# Patient Record
Sex: Male | Born: 1945
Health system: Southern US, Community
[De-identification: ages and names within clinical notes are randomized; demographics above are authoritative.]

## PROBLEM LIST (undated history)

## (undated) DIAGNOSIS — R7302 Impaired glucose tolerance (oral): Secondary | ICD-10-CM

## (undated) DIAGNOSIS — H1030 Unspecified acute conjunctivitis, unspecified eye: Secondary | ICD-10-CM

## (undated) DIAGNOSIS — I10 Essential (primary) hypertension: Secondary | ICD-10-CM

## (undated) DIAGNOSIS — M109 Gout, unspecified: Secondary | ICD-10-CM

## (undated) DIAGNOSIS — F411 Generalized anxiety disorder: Secondary | ICD-10-CM

## (undated) DIAGNOSIS — E039 Hypothyroidism, unspecified: Secondary | ICD-10-CM

## (undated) DIAGNOSIS — J309 Allergic rhinitis, unspecified: Secondary | ICD-10-CM

## (undated) DIAGNOSIS — R21 Rash and other nonspecific skin eruption: Secondary | ICD-10-CM

## (undated) DIAGNOSIS — E739 Lactose intolerance, unspecified: Secondary | ICD-10-CM

## (undated) DIAGNOSIS — J069 Acute upper respiratory infection, unspecified: Secondary | ICD-10-CM

## (undated) DIAGNOSIS — E559 Vitamin D deficiency, unspecified: Secondary | ICD-10-CM

## (undated) DIAGNOSIS — J019 Acute sinusitis, unspecified: Secondary | ICD-10-CM

## (undated) DIAGNOSIS — G4710 Hypersomnia, unspecified: Secondary | ICD-10-CM

## (undated) DIAGNOSIS — R609 Edema, unspecified: Secondary | ICD-10-CM

## (undated) DIAGNOSIS — E7800 Pure hypercholesterolemia, unspecified: Secondary | ICD-10-CM

## (undated) DIAGNOSIS — R7301 Impaired fasting glucose: Secondary | ICD-10-CM

## (undated) DIAGNOSIS — N400 Enlarged prostate without lower urinary tract symptoms: Secondary | ICD-10-CM

## (undated) DIAGNOSIS — E291 Testicular hypofunction: Secondary | ICD-10-CM

## (undated) DIAGNOSIS — E785 Hyperlipidemia, unspecified: Secondary | ICD-10-CM

## (undated) HISTORY — DX: Hypersomnia, unspecified: G47.10

## (undated) HISTORY — DX: Pure hypercholesterolemia, unspecified: E78.00

## (undated) HISTORY — DX: Generalized anxiety disorder: F41.1

## (undated) HISTORY — PX: HERNIA REPAIR: SHX51

## (undated) HISTORY — DX: Rash and other nonspecific skin eruption: R21

## (undated) HISTORY — DX: Hyperlipidemia, unspecified: E78.5

## (undated) HISTORY — DX: Morbid (severe) obesity due to excess calories: E66.01

## (undated) HISTORY — DX: Hypothyroidism, unspecified: E03.9

## (undated) HISTORY — DX: Essential (primary) hypertension: I10

## (undated) HISTORY — DX: Lactose intolerance, unspecified: E73.9

## (undated) HISTORY — DX: Testicular hypofunction: E29.1

## (undated) HISTORY — DX: Gout, unspecified: M10.9

## (undated) HISTORY — DX: Acute sinusitis, unspecified: J01.90

## (undated) HISTORY — DX: Impaired fasting glucose: R73.01

## (undated) HISTORY — DX: Benign prostatic hyperplasia without lower urinary tract symptoms: N40.0

## (undated) HISTORY — DX: Allergic rhinitis, unspecified: J30.9

## (undated) HISTORY — DX: Acute upper respiratory infection, unspecified: J06.9

## (undated) HISTORY — DX: Edema, unspecified: R60.9

## (undated) HISTORY — DX: Vitamin D deficiency, unspecified: E55.9

## (undated) HISTORY — DX: Unspecified acute conjunctivitis, unspecified eye: H10.30

## (undated) HISTORY — DX: Impaired glucose tolerance (oral): R73.02

---

## 1976-06-10 HISTORY — PX: URETER SURGERY: SHX823

## 1997-12-17 ENCOUNTER — Emergency Department (HOSPITAL_COMMUNITY): Admission: EM | Admit: 1997-12-17 | Discharge: 1997-12-17 | Payer: Self-pay

## 1999-05-14 ENCOUNTER — Encounter: Payer: Self-pay | Admitting: Orthopedic Surgery

## 1999-05-14 ENCOUNTER — Ambulatory Visit (HOSPITAL_COMMUNITY): Admission: RE | Admit: 1999-05-14 | Discharge: 1999-05-14 | Payer: Self-pay | Admitting: Orthopedic Surgery

## 2003-07-22 ENCOUNTER — Emergency Department (HOSPITAL_COMMUNITY): Admission: EM | Admit: 2003-07-22 | Discharge: 2003-07-22 | Payer: Self-pay | Admitting: Emergency Medicine

## 2004-05-17 ENCOUNTER — Ambulatory Visit: Payer: Self-pay | Admitting: Cardiology

## 2004-10-02 ENCOUNTER — Ambulatory Visit: Payer: Self-pay

## 2004-10-12 ENCOUNTER — Ambulatory Visit: Payer: Self-pay | Admitting: Cardiovascular Disease

## 2005-01-10 ENCOUNTER — Ambulatory Visit: Payer: Self-pay | Admitting: Internal Medicine

## 2005-02-14 ENCOUNTER — Ambulatory Visit: Payer: Self-pay | Admitting: Internal Medicine

## 2005-04-02 ENCOUNTER — Ambulatory Visit: Payer: Self-pay | Admitting: Internal Medicine

## 2005-04-02 LAB — CONVERTED CEMR LAB: PSA: 0.38 ng/mL

## 2005-04-16 ENCOUNTER — Ambulatory Visit: Payer: Self-pay | Admitting: Internal Medicine

## 2005-09-07 ENCOUNTER — Emergency Department (HOSPITAL_COMMUNITY): Admission: EM | Admit: 2005-09-07 | Discharge: 2005-09-07 | Payer: Self-pay | Admitting: Emergency Medicine

## 2005-09-16 ENCOUNTER — Ambulatory Visit: Payer: Self-pay | Admitting: Internal Medicine

## 2005-09-27 ENCOUNTER — Ambulatory Visit: Payer: Self-pay

## 2005-10-03 ENCOUNTER — Ambulatory Visit: Payer: Self-pay | Admitting: *Deleted

## 2005-10-10 ENCOUNTER — Ambulatory Visit: Payer: Self-pay | Admitting: Cardiology

## 2006-03-27 ENCOUNTER — Ambulatory Visit: Payer: Self-pay | Admitting: Cardiology

## 2006-04-03 ENCOUNTER — Ambulatory Visit: Payer: Self-pay | Admitting: Cardiology

## 2006-04-08 ENCOUNTER — Ambulatory Visit: Payer: Self-pay | Admitting: Internal Medicine

## 2006-04-08 LAB — CONVERTED CEMR LAB
ALT: 25 units/L (ref 0–40)
AST: 23 units/L (ref 0–37)
Albumin: 4 g/dL (ref 3.5–5.2)
Alkaline Phosphatase: 50 units/L (ref 39–117)
BUN: 10 mg/dL (ref 6–23)
Basophils Absolute: 0 10*3/uL (ref 0.0–0.1)
Basophils Relative: 0.3 % (ref 0.0–1.0)
Bilirubin Urine: NEGATIVE
CO2: 31 meq/L (ref 19–32)
Calcium: 9.6 mg/dL (ref 8.4–10.5)
Chloride: 104 meq/L (ref 96–112)
Chol/HDL Ratio, serum: 4.3
Cholesterol: 148 mg/dL (ref 0–200)
Creatinine, Ser: 1.3 mg/dL (ref 0.4–1.5)
Eosinophil percent: 2 % (ref 0.0–5.0)
GFR calc non Af Amer: 60 mL/min
Glomerular Filtration Rate, Af Am: 73 mL/min/{1.73_m2}
Glucose, Bld: 117 mg/dL — ABNORMAL HIGH (ref 70–99)
HCT: 45.6 % (ref 39.0–52.0)
HDL: 34.5 mg/dL — ABNORMAL LOW (ref >39.0)
Hemoglobin, Urine: NEGATIVE
Hemoglobin: 15.2 g/dL (ref 13.0–17.0)
Ketones, ur: NEGATIVE mg/dL
LDL Cholesterol: 84 mg/dL (ref 0–99)
Leukocytes, UA: NEGATIVE
Lymphocytes Relative: 27.1 % (ref 12.0–46.0)
MCHC: 33.4 g/dL (ref 30.0–36.0)
MCV: 88.8 fL (ref 78.0–100.0)
Monocytes Absolute: 0.6 10*3/uL (ref 0.2–0.7)
Monocytes Relative: 9.7 % (ref 3.0–11.0)
Neutro Abs: 3.8 10*3/uL (ref 1.4–7.7)
Neutrophils Relative %: 60.9 % (ref 43.0–77.0)
Nitrite: NEGATIVE
PSA: 0.39 ng/mL (ref 0.10–4.00)
Platelets: 158 10*3/uL (ref 150–400)
Potassium: 5.1 meq/L (ref 3.5–5.1)
RBC: 5.13 M/uL (ref 4.22–5.81)
RDW: 12.6 % (ref 11.5–14.6)
Sodium: 140 meq/L (ref 135–145)
Specific Gravity, Urine: 1.015 (ref 1.000–1.03)
TSH: 9.19 microintl units/mL — ABNORMAL HIGH (ref 0.35–5.50)
Total Bilirubin: 1.5 mg/dL — ABNORMAL HIGH (ref 0.3–1.2)
Total Protein, Urine: NEGATIVE mg/dL
Total Protein: 7.5 g/dL (ref 6.0–8.3)
Triglyceride fasting, serum: 147 mg/dL (ref 0–149)
Urine Glucose: NEGATIVE mg/dL
Urobilinogen, UA: 0.2 (ref 0.0–1.0)
VLDL: 29 mg/dL (ref 0–40)
WBC: 6.2 10*3/uL (ref 4.5–10.5)
pH: 6.5 (ref 5.0–8.0)

## 2006-04-11 ENCOUNTER — Ambulatory Visit: Payer: Self-pay | Admitting: Internal Medicine

## 2006-05-30 ENCOUNTER — Ambulatory Visit: Payer: Self-pay | Admitting: Internal Medicine

## 2006-05-30 LAB — CONVERTED CEMR LAB: TSH: 0.95 microintl units/mL (ref 0.35–5.50)

## 2006-09-29 ENCOUNTER — Ambulatory Visit: Payer: Self-pay

## 2006-09-29 LAB — CONVERTED CEMR LAB
ALT: 23 units/L (ref 0–40)
AST: 19 units/L (ref 0–37)
Albumin: 4 g/dL (ref 3.5–5.2)
Alkaline Phosphatase: 54 units/L (ref 39–117)
Bilirubin, Direct: 0.1 mg/dL (ref 0.0–0.3)
Cholesterol: 135 mg/dL (ref 0–200)
HDL: 37.1 mg/dL — ABNORMAL LOW (ref >39.0)
LDL Cholesterol: 65 mg/dL (ref 0–99)
Total Bilirubin: 1.1 mg/dL (ref 0.3–1.2)
Total CHOL/HDL Ratio: 3.6
Total Protein: 7.2 g/dL (ref 6.0–8.3)
Triglycerides: 164 mg/dL — ABNORMAL HIGH (ref 0–149)
VLDL: 33 mg/dL (ref 0–40)

## 2006-10-02 ENCOUNTER — Ambulatory Visit: Payer: Self-pay | Admitting: Cardiology

## 2007-03-03 ENCOUNTER — Ambulatory Visit: Payer: Self-pay | Admitting: Internal Medicine

## 2007-03-04 ENCOUNTER — Encounter: Payer: Self-pay | Admitting: Internal Medicine

## 2007-03-04 DIAGNOSIS — E039 Hypothyroidism, unspecified: Secondary | ICD-10-CM

## 2007-03-04 DIAGNOSIS — E785 Hyperlipidemia, unspecified: Secondary | ICD-10-CM

## 2007-03-04 DIAGNOSIS — I10 Essential (primary) hypertension: Secondary | ICD-10-CM | POA: Insufficient documentation

## 2007-03-04 DIAGNOSIS — M109 Gout, unspecified: Secondary | ICD-10-CM

## 2007-03-04 DIAGNOSIS — F411 Generalized anxiety disorder: Secondary | ICD-10-CM | POA: Insufficient documentation

## 2007-03-04 DIAGNOSIS — N400 Enlarged prostate without lower urinary tract symptoms: Secondary | ICD-10-CM | POA: Insufficient documentation

## 2007-03-04 HISTORY — DX: Morbid (severe) obesity due to excess calories: E66.01

## 2007-03-04 HISTORY — DX: Essential (primary) hypertension: I10

## 2007-03-04 HISTORY — DX: Benign prostatic hyperplasia without lower urinary tract symptoms: N40.0

## 2007-03-04 HISTORY — DX: Gout, unspecified: M10.9

## 2007-03-04 HISTORY — DX: Hyperlipidemia, unspecified: E78.5

## 2007-03-04 HISTORY — DX: Hypothyroidism, unspecified: E03.9

## 2007-03-04 HISTORY — DX: Generalized anxiety disorder: F41.1

## 2007-03-30 ENCOUNTER — Ambulatory Visit: Payer: Self-pay | Admitting: Cardiology

## 2007-03-30 LAB — CONVERTED CEMR LAB
ALT: 19 units/L (ref 0–53)
AST: 17 units/L (ref 0–37)
Albumin: 4 g/dL (ref 3.5–5.2)
Alkaline Phosphatase: 51 units/L (ref 39–117)
Bilirubin, Direct: 0.2 mg/dL (ref 0.0–0.3)
Cholesterol: 129 mg/dL (ref 0–200)
HDL: 35.1 mg/dL — ABNORMAL LOW (ref >39.0)
LDL Cholesterol: 64 mg/dL (ref 0–99)
Total Bilirubin: 1 mg/dL (ref 0.3–1.2)
Total CHOL/HDL Ratio: 3.7
Total Protein: 7.5 g/dL (ref 6.0–8.3)
Triglycerides: 152 mg/dL — ABNORMAL HIGH (ref 0–149)
VLDL: 30 mg/dL (ref 0–40)

## 2007-04-02 ENCOUNTER — Ambulatory Visit: Payer: Self-pay | Admitting: Cardiovascular Disease

## 2007-04-16 ENCOUNTER — Ambulatory Visit: Payer: Self-pay | Admitting: Internal Medicine

## 2007-04-16 DIAGNOSIS — R7301 Impaired fasting glucose: Secondary | ICD-10-CM | POA: Insufficient documentation

## 2007-04-16 HISTORY — DX: Impaired fasting glucose: R73.01

## 2007-04-16 LAB — CONVERTED CEMR LAB
ALT: 28 units/L (ref 0–53)
AST: 21 units/L (ref 0–37)
Alkaline Phosphatase: 56 units/L (ref 39–117)
BUN: 8 mg/dL (ref 6–23)
Basophils Relative: 0.5 % (ref 0.0–1.0)
Bilirubin Urine: NEGATIVE
CO2: 30 meq/L (ref 19–32)
Calcium: 9.4 mg/dL (ref 8.4–10.5)
Chloride: 106 meq/L (ref 96–112)
Creatinine, Ser: 1.1 mg/dL (ref 0.4–1.5)
Hemoglobin: 14.7 g/dL (ref 13.0–17.0)
Leukocytes, UA: NEGATIVE
Monocytes Absolute: 0.5 10*3/uL (ref 0.2–0.7)
Monocytes Relative: 7.4 % (ref 3.0–11.0)
Platelets: 165 10*3/uL (ref 150–400)
RDW: 12.7 % (ref 11.5–14.6)
Specific Gravity, Urine: 1.01 (ref 1.000–1.03)
Total Bilirubin: 1.2 mg/dL (ref 0.3–1.2)
Total Protein, Urine: NEGATIVE mg/dL
Total Protein: 7.2 g/dL (ref 6.0–8.3)
Triglycerides: 188 mg/dL — ABNORMAL HIGH (ref 0–149)
VLDL: 38 mg/dL (ref 0–40)
pH: 7.5 (ref 5.0–8.0)

## 2007-04-23 ENCOUNTER — Ambulatory Visit: Payer: Self-pay | Admitting: Internal Medicine

## 2007-04-23 DIAGNOSIS — E739 Lactose intolerance, unspecified: Secondary | ICD-10-CM | POA: Insufficient documentation

## 2007-04-23 HISTORY — DX: Lactose intolerance, unspecified: E73.9

## 2007-07-01 ENCOUNTER — Ambulatory Visit: Payer: Self-pay | Admitting: Internal Medicine

## 2007-09-17 ENCOUNTER — Ambulatory Visit: Payer: Self-pay | Admitting: Internal Medicine

## 2007-09-24 ENCOUNTER — Ambulatory Visit: Payer: Self-pay | Admitting: Cardiology

## 2007-10-07 ENCOUNTER — Ambulatory Visit: Payer: Self-pay | Admitting: Internal Medicine

## 2007-10-07 DIAGNOSIS — J309 Allergic rhinitis, unspecified: Secondary | ICD-10-CM

## 2007-10-07 HISTORY — DX: Allergic rhinitis, unspecified: J30.9

## 2007-11-27 ENCOUNTER — Ambulatory Visit: Payer: Self-pay | Admitting: Internal Medicine

## 2007-11-27 DIAGNOSIS — H1030 Unspecified acute conjunctivitis, unspecified eye: Secondary | ICD-10-CM | POA: Insufficient documentation

## 2007-11-27 DIAGNOSIS — J069 Acute upper respiratory infection, unspecified: Secondary | ICD-10-CM | POA: Insufficient documentation

## 2007-11-27 HISTORY — DX: Unspecified acute conjunctivitis, unspecified eye: H10.30

## 2007-11-27 HISTORY — DX: Acute upper respiratory infection, unspecified: J06.9

## 2008-01-25 ENCOUNTER — Telehealth: Payer: Self-pay | Admitting: Internal Medicine

## 2008-02-04 ENCOUNTER — Ambulatory Visit: Payer: Self-pay | Admitting: Cardiology

## 2008-02-04 LAB — CONVERTED CEMR LAB
ALT: 19 units/L (ref 0–53)
Albumin: 4.2 g/dL (ref 3.5–5.2)
HDL: 34.3 mg/dL — ABNORMAL LOW (ref >39.0)
Total Bilirubin: 1.3 mg/dL — ABNORMAL HIGH (ref 0.3–1.2)
Total CHOL/HDL Ratio: 3.7
VLDL: 24 mg/dL (ref 0–40)

## 2008-02-11 ENCOUNTER — Ambulatory Visit: Payer: Self-pay | Admitting: Internal Medicine

## 2008-04-04 ENCOUNTER — Telehealth: Payer: Self-pay | Admitting: Internal Medicine

## 2008-04-18 ENCOUNTER — Ambulatory Visit: Payer: Self-pay | Admitting: Internal Medicine

## 2008-04-18 LAB — CONVERTED CEMR LAB
Albumin: 3.9 g/dL (ref 3.5–5.2)
BUN: 7 mg/dL (ref 6–23)
Basophils Absolute: 0 10*3/uL (ref 0.0–0.1)
Basophils Relative: 0.6 % (ref 0.0–3.0)
Calcium: 9 mg/dL (ref 8.4–10.5)
Chloride: 107 meq/L (ref 96–112)
Cholesterol: 112 mg/dL (ref 0–200)
Creatinine, Ser: 1.1 mg/dL (ref 0.4–1.5)
Eosinophils Absolute: 0.1 10*3/uL (ref 0.0–0.7)
GFR calc Af Amer: 88 mL/min
GFR calc non Af Amer: 73 mL/min
HCT: 41.5 % (ref 39.0–52.0)
Ketones, ur: NEGATIVE mg/dL
LDL Cholesterol: 59 mg/dL (ref 0–99)
MCHC: 34.6 g/dL (ref 30.0–36.0)
MCV: 90.1 fL (ref 78.0–100.0)
Monocytes Absolute: 0.5 10*3/uL (ref 0.1–1.0)
Neutro Abs: 3.6 10*3/uL (ref 1.4–7.7)
Neutrophils Relative %: 63.1 % (ref 43.0–77.0)
PSA: 0.41 ng/mL (ref 0.10–4.00)
RBC: 4.6 M/uL (ref 4.22–5.81)
Specific Gravity, Urine: 1.015 (ref 1.000–1.03)
TSH: 0.55 microintl units/mL (ref 0.35–5.50)
Total Bilirubin: 1 mg/dL (ref 0.3–1.2)
Total Protein, Urine: NEGATIVE mg/dL
Triglycerides: 100 mg/dL (ref 0–149)
Urine Glucose: NEGATIVE mg/dL
Urobilinogen, UA: 0.2 (ref 0.0–1.0)
VLDL: 20 mg/dL (ref 0–40)

## 2008-04-25 ENCOUNTER — Ambulatory Visit: Payer: Self-pay | Admitting: Internal Medicine

## 2008-05-16 ENCOUNTER — Ambulatory Visit: Payer: Self-pay | Admitting: Internal Medicine

## 2008-05-16 DIAGNOSIS — J019 Acute sinusitis, unspecified: Secondary | ICD-10-CM | POA: Insufficient documentation

## 2008-05-16 HISTORY — DX: Acute sinusitis, unspecified: J01.90

## 2008-06-27 ENCOUNTER — Ambulatory Visit: Payer: Self-pay | Admitting: Cardiology

## 2008-06-27 LAB — CONVERTED CEMR LAB
ALT: 18 units/L (ref 0–53)
AST: 18 units/L (ref 0–37)
Albumin: 3.9 g/dL (ref 3.5–5.2)
Alkaline Phosphatase: 50 units/L (ref 39–117)
Cholesterol: 125 mg/dL (ref 0–200)
HDL: 33.2 mg/dL — ABNORMAL LOW (ref >39.0)
Triglycerides: 128 mg/dL (ref 0–149)

## 2008-06-30 ENCOUNTER — Ambulatory Visit: Payer: Self-pay | Admitting: Cardiology

## 2008-10-17 ENCOUNTER — Ambulatory Visit: Payer: Self-pay | Admitting: Internal Medicine

## 2008-10-17 DIAGNOSIS — E7800 Pure hypercholesterolemia, unspecified: Secondary | ICD-10-CM | POA: Insufficient documentation

## 2008-10-17 HISTORY — DX: Pure hypercholesterolemia, unspecified: E78.00

## 2008-10-18 LAB — CONVERTED CEMR LAB
ALT: 18 units/L (ref 0–53)
Alkaline Phosphatase: 54 units/L (ref 39–117)
Bilirubin, Direct: 0.2 mg/dL (ref 0.0–0.3)
Cholesterol: 125 mg/dL (ref 0–200)
LDL Cholesterol: 66 mg/dL (ref 0–99)
Total Bilirubin: 1 mg/dL (ref 0.3–1.2)
Total Protein: 7.3 g/dL (ref 6.0–8.3)
VLDL: 26.4 mg/dL (ref 0.0–40.0)

## 2008-11-03 ENCOUNTER — Ambulatory Visit: Payer: Self-pay | Admitting: Cardiovascular Disease

## 2008-11-04 ENCOUNTER — Ambulatory Visit: Payer: Self-pay | Admitting: Internal Medicine

## 2008-11-04 DIAGNOSIS — M109 Gout, unspecified: Secondary | ICD-10-CM | POA: Insufficient documentation

## 2008-11-04 DIAGNOSIS — R609 Edema, unspecified: Secondary | ICD-10-CM

## 2008-11-04 HISTORY — DX: Gout, unspecified: M10.9

## 2008-11-04 HISTORY — DX: Edema, unspecified: R60.9

## 2008-11-14 ENCOUNTER — Ambulatory Visit: Payer: Self-pay | Admitting: Internal Medicine

## 2008-11-15 LAB — CONVERTED CEMR LAB
BUN: 19 mg/dL (ref 6–23)
Chloride: 110 meq/L (ref 96–112)
Creatinine, Ser: 1.2 mg/dL (ref 0.4–1.5)
Glucose, Bld: 106 mg/dL — ABNORMAL HIGH (ref 70–99)
Potassium: 4.6 meq/L (ref 3.5–5.1)

## 2009-02-22 ENCOUNTER — Ambulatory Visit: Payer: Self-pay | Admitting: Internal Medicine

## 2009-02-22 LAB — CONVERTED CEMR LAB
ALT: 24 units/L (ref 0–53)
Basophils Absolute: 0.1 10*3/uL (ref 0.0–0.1)
Bilirubin, Direct: 0.1 mg/dL (ref 0.0–0.3)
CO2: 30 meq/L (ref 19–32)
Calcium: 9.6 mg/dL (ref 8.4–10.5)
Creatinine, Ser: 1.3 mg/dL (ref 0.4–1.5)
Eosinophils Absolute: 0.1 10*3/uL (ref 0.0–0.7)
GFR calc non Af Amer: 59.63 mL/min (ref >60)
Glucose, Bld: 113 mg/dL — ABNORMAL HIGH (ref 70–99)
Hgb A1c MFr Bld: 5.8 % (ref 4.6–6.5)
Lymphocytes Relative: 26.1 % (ref 12.0–46.0)
MCHC: 34.5 g/dL (ref 30.0–36.0)
Monocytes Relative: 9.2 % (ref 3.0–12.0)
Neutrophils Relative %: 61.7 % (ref 43.0–77.0)
Platelets: 150 10*3/uL (ref 150.0–400.0)
RDW: 13.1 % (ref 11.5–14.6)
Sodium: 145 meq/L (ref 135–145)
TSH: 1.04 microintl units/mL (ref 0.35–5.50)
Total Bilirubin: 0.9 mg/dL (ref 0.3–1.2)

## 2009-04-06 ENCOUNTER — Telehealth: Payer: Self-pay | Admitting: Internal Medicine

## 2009-04-19 ENCOUNTER — Ambulatory Visit: Payer: Self-pay | Admitting: Internal Medicine

## 2009-04-19 LAB — CONVERTED CEMR LAB
Bilirubin, Direct: 0.1 mg/dL (ref 0.0–0.3)
CO2: 30 meq/L (ref 19–32)
Chloride: 105 meq/L (ref 96–112)
Eosinophils Absolute: 0.2 10*3/uL (ref 0.0–0.7)
Eosinophils Relative: 2.6 % (ref 0.0–5.0)
GFR calc non Af Amer: 59.6 mL/min (ref >60)
Glucose, Bld: 102 mg/dL — ABNORMAL HIGH (ref 70–99)
HDL: 31.5 mg/dL — ABNORMAL LOW (ref >39.00)
LDL Cholesterol: 62 mg/dL (ref 0–99)
Leukocytes, UA: NEGATIVE
Lymphocytes Relative: 26.8 % (ref 12.0–46.0)
MCV: 91.3 fL (ref 78.0–100.0)
Monocytes Absolute: 0.6 10*3/uL (ref 0.1–1.0)
Neutrophils Relative %: 61 % (ref 43.0–77.0)
Nitrite: NEGATIVE
PSA: 0.46 ng/mL (ref 0.10–4.00)
Platelets: 132 10*3/uL — ABNORMAL LOW (ref 150.0–400.0)
Potassium: 4.6 meq/L (ref 3.5–5.1)
Sodium: 145 meq/L (ref 135–145)
Specific Gravity, Urine: <=1.005 (ref 1.000–1.030)
Total Bilirubin: 1.1 mg/dL (ref 0.3–1.2)
Uric Acid, Serum: 9.9 mg/dL — ABNORMAL HIGH (ref 4.0–7.8)
VLDL: 33.4 mg/dL (ref 0.0–40.0)
WBC: 6.4 10*3/uL (ref 4.5–10.5)
pH: 6 (ref 5.0–8.0)

## 2009-04-25 ENCOUNTER — Ambulatory Visit: Payer: Self-pay | Admitting: Internal Medicine

## 2009-05-09 ENCOUNTER — Telehealth (INDEPENDENT_AMBULATORY_CARE_PROVIDER_SITE_OTHER): Payer: Self-pay | Admitting: *Deleted

## 2009-05-11 ENCOUNTER — Ambulatory Visit: Payer: Self-pay | Admitting: Cardiovascular Disease

## 2009-05-11 LAB — CONVERTED CEMR LAB
Cholesterol, target level: 200 mg/dL
LDL Goal: 130 mg/dL

## 2009-05-26 ENCOUNTER — Ambulatory Visit: Payer: Self-pay | Admitting: Internal Medicine

## 2009-07-25 ENCOUNTER — Ambulatory Visit: Payer: Self-pay | Admitting: Internal Medicine

## 2009-07-25 DIAGNOSIS — R21 Rash and other nonspecific skin eruption: Secondary | ICD-10-CM

## 2009-07-25 HISTORY — DX: Rash and other nonspecific skin eruption: R21

## 2009-10-09 ENCOUNTER — Ambulatory Visit: Payer: Self-pay | Admitting: Internal Medicine

## 2009-11-27 ENCOUNTER — Ambulatory Visit: Payer: Self-pay | Admitting: Cardiology

## 2009-11-28 ENCOUNTER — Encounter: Payer: Self-pay | Admitting: Cardiology

## 2009-11-28 LAB — CONVERTED CEMR LAB
AST: 16 units/L (ref 0–37)
Alkaline Phosphatase: 53 units/L (ref 39–117)
Total Bilirubin: 1 mg/dL (ref 0.3–1.2)
Total CHOL/HDL Ratio: 3

## 2009-11-30 ENCOUNTER — Ambulatory Visit: Payer: Self-pay | Admitting: Internal Medicine

## 2010-02-07 ENCOUNTER — Telehealth: Payer: Self-pay | Admitting: Internal Medicine

## 2010-02-20 ENCOUNTER — Ambulatory Visit: Payer: Self-pay | Admitting: Internal Medicine

## 2010-02-20 ENCOUNTER — Encounter: Payer: Self-pay | Admitting: Internal Medicine

## 2010-02-20 DIAGNOSIS — G4710 Hypersomnia, unspecified: Secondary | ICD-10-CM

## 2010-02-20 HISTORY — DX: Hypersomnia, unspecified: G47.10

## 2010-02-20 LAB — CONVERTED CEMR LAB
Albumin: 4.2 g/dL (ref 3.5–5.2)
Alkaline Phosphatase: 54 units/L (ref 39–117)
Basophils Absolute: 0 10*3/uL (ref 0.0–0.1)
CO2: 31 meq/L (ref 19–32)
Calcium: 9.4 mg/dL (ref 8.4–10.5)
Creatinine, Ser: 1.4 mg/dL (ref 0.4–1.5)
Eosinophils Absolute: 0.1 10*3/uL (ref 0.0–0.7)
Glucose, Bld: 107 mg/dL — ABNORMAL HIGH (ref 70–99)
Hemoglobin: 13.7 g/dL (ref 13.0–17.0)
Hgb A1c MFr Bld: 6.1 % (ref 4.6–6.5)
Leukocytes, UA: NEGATIVE
Lymphocytes Relative: 28.4 % (ref 12.0–46.0)
MCHC: 34.2 g/dL (ref 30.0–36.0)
Neutro Abs: 3.8 10*3/uL (ref 1.4–7.7)
Nitrite: NEGATIVE
RDW: 13.8 % (ref 11.5–14.6)
Sodium: 143 meq/L (ref 135–145)
Specific Gravity, Urine: 1.025 (ref 1.000–1.030)
TSH: 0.28 microintl units/mL — ABNORMAL LOW (ref 0.35–5.50)
Urine Glucose: NEGATIVE mg/dL
Urobilinogen, UA: 0.2 (ref 0.0–1.0)

## 2010-03-01 ENCOUNTER — Encounter: Payer: Self-pay | Admitting: Internal Medicine

## 2010-03-08 ENCOUNTER — Ambulatory Visit: Payer: Self-pay | Admitting: Pulmonary Disease

## 2010-04-05 ENCOUNTER — Encounter: Payer: Self-pay | Admitting: Pulmonary Disease

## 2010-04-05 ENCOUNTER — Ambulatory Visit (HOSPITAL_BASED_OUTPATIENT_CLINIC_OR_DEPARTMENT_OTHER): Admission: RE | Admit: 2010-04-05 | Discharge: 2010-04-05 | Payer: Self-pay | Admitting: Pulmonary Disease

## 2010-04-10 ENCOUNTER — Ambulatory Visit: Payer: Self-pay | Admitting: Internal Medicine

## 2010-04-10 LAB — CONVERTED CEMR LAB
ALT: 25 units/L (ref 0–53)
AST: 22 units/L (ref 0–37)
Albumin: 4 g/dL (ref 3.5–5.2)
Bilirubin Urine: NEGATIVE
Bilirubin Urine: NEGATIVE
Chloride: 106 meq/L (ref 96–112)
Eosinophils Relative: 2.2 % (ref 0.0–5.0)
GFR calc non Af Amer: 62.16 mL/min (ref >60)
Glucose, Bld: 105 mg/dL — ABNORMAL HIGH (ref 70–99)
HCT: 41 % (ref 39.0–52.0)
Hemoglobin, Urine: NEGATIVE
Hemoglobin, Urine: NEGATIVE
Hemoglobin: 13.9 g/dL (ref 13.0–17.0)
Ketones, ur: NEGATIVE mg/dL
Ketones, ur: NEGATIVE mg/dL
Leukocytes, UA: NEGATIVE
Lymphs Abs: 1.9 10*3/uL (ref 0.7–4.0)
MCV: 89.6 fL (ref 78.0–100.0)
Monocytes Absolute: 0.6 10*3/uL (ref 0.1–1.0)
Monocytes Relative: 9.3 % (ref 3.0–12.0)
Neutro Abs: 3.6 10*3/uL (ref 1.4–7.7)
Potassium: 5.1 meq/L (ref 3.5–5.1)
RDW: 13.3 % (ref 11.5–14.6)
Sodium: 142 meq/L (ref 135–145)
TSH: 0.5 microintl units/mL (ref 0.35–5.50)
Total Protein, Urine: NEGATIVE mg/dL
Urine Glucose: NEGATIVE mg/dL
Urobilinogen, UA: 0.2 (ref 0.0–1.0)
VLDL: 27 mg/dL (ref 0.0–40.0)

## 2010-04-16 ENCOUNTER — Encounter: Payer: Self-pay | Admitting: Internal Medicine

## 2010-04-17 ENCOUNTER — Ambulatory Visit: Payer: Self-pay | Admitting: Internal Medicine

## 2010-04-19 ENCOUNTER — Telehealth (INDEPENDENT_AMBULATORY_CARE_PROVIDER_SITE_OTHER): Payer: Self-pay | Admitting: *Deleted

## 2010-04-19 ENCOUNTER — Ambulatory Visit: Payer: Self-pay | Admitting: Pulmonary Disease

## 2010-04-23 ENCOUNTER — Ambulatory Visit: Payer: Self-pay | Admitting: Internal Medicine

## 2010-04-26 ENCOUNTER — Encounter: Payer: Self-pay | Admitting: Internal Medicine

## 2010-04-26 ENCOUNTER — Ambulatory Visit: Payer: Self-pay | Admitting: Pulmonary Disease

## 2010-04-26 ENCOUNTER — Ambulatory Visit: Payer: Self-pay

## 2010-04-26 ENCOUNTER — Ambulatory Visit (HOSPITAL_COMMUNITY): Admission: RE | Admit: 2010-04-26 | Discharge: 2010-04-26 | Payer: Self-pay | Admitting: Internal Medicine

## 2010-04-27 ENCOUNTER — Ambulatory Visit: Payer: Self-pay | Admitting: Cardiology

## 2010-05-01 ENCOUNTER — Encounter: Payer: Self-pay | Admitting: Internal Medicine

## 2010-06-20 ENCOUNTER — Ambulatory Visit
Admission: RE | Admit: 2010-06-20 | Discharge: 2010-06-20 | Payer: Self-pay | Source: Home / Self Care | Attending: Internal Medicine | Admitting: Internal Medicine

## 2010-06-20 DIAGNOSIS — E559 Vitamin D deficiency, unspecified: Secondary | ICD-10-CM

## 2010-06-20 DIAGNOSIS — E291 Testicular hypofunction: Secondary | ICD-10-CM | POA: Insufficient documentation

## 2010-06-20 HISTORY — DX: Testicular hypofunction: E29.1

## 2010-06-20 HISTORY — DX: Vitamin D deficiency, unspecified: E55.9

## 2010-07-10 NOTE — Assessment & Plan Note (Signed)
Summary: ankles are swelling/nws   Vital Signs:  Patient profile:   65 year old male Height:      69 inches Weight:      240.25 pounds O2 Sat:      95 % on Room air Temp:     99.3 degrees F rectal Pulse rate:   98 / minute BP sitting:   110 / 68  (left arm) Cuff size:   regular  Vitals Entered By: Zella Ball Ewing CMA Duncan Dull) (February 20, 2010 3:37 PM)  O2 Flow:  Room air  CC: Both Ankles swelling/Re   CC:  Both Ankles swelling/Re.  History of Present Illness: here to f/u with acute -  pt states has not had to take the lasix for several months over the summer,  but in the past wk has had incr and persistent sweling to both legs, depsite no obvious change in diet, salt or fluid intake or med change.   Pt denies CP, worsening sob, doe, wheezing, orthopnea, pnd, palps, dizziness or syncope  Pt denies new neuro symptoms such as headache, facial or extremity weakness  Has been standing at work.  Sleeps well at home at night, but also very easy to fall asleep during the day when no stimulation ( such as time away from work);  snores at night, and wife has awakened him at night to ask if he's still breathing and alive.  Has overall gianed about 5  bs with the worsening leg sweling.  No fever, wt loss, night sweats, loss of appetite or other constitutional symptoms   Problems Prior to Update: 1)  Hypersomnia  (ICD-780.54) 2)  Rash-nonvesicular  (ICD-782.1) 3)  Rash-nonvesicular  (ICD-782.1) 4)  Peripheral Edema  (ICD-782.3) 5)  Peripheral Edema  (ICD-782.3) 6)  Acute Gouty Arthropathy  (ICD-274.01) 7)  Encounter For Long-term Use of Other Medications  (ICD-V58.69) 8)  Pure Hypercholesterolemia  (ICD-272.0) 9)  Sinusitis- Acute-nos  (ICD-461.9) 10)  Preventive Health Care  (ICD-V70.0) 11)  Uri  (ICD-465.9) 12)  Conjunctivitis, Acute, Left  (ICD-372.00) 13)  Allergic Rhinitis  (ICD-477.9) 14)  Preventive Health Care  (ICD-V70.0) 15)  Glucose Intolerance  (ICD-271.3) 16)  Impaired Fasting  Glucose  (ICD-790.21) 17)  Special Screening Malignant Neoplasm of Prostate  (ICD-V76.44) 18)  Routine General Medical Exam@health  Care Facl  (ICD-V70.0) 19)  Benign Prostatic Hypertrophy  (ICD-600.00) 20)  Anxiety  (ICD-300.00) 21)  Morbid Obesity  (ICD-278.01) 22)  Hypothyroidism  (ICD-244.9) 23)  Hyperlipidemia  (ICD-272.4) 24)  Gout  (ICD-274.9) 25)  Hypertension  (ICD-401.9)  Medications Prior to Update: 1)  Flomax 0.4 Mg  Cp24 (Tamsulosin Hcl) .Marland Kitchen.. 1 By Mouth Once Daily 2)  Levothyroxine Sodium 100 Mcg  Tabs (Levothyroxine Sodium) .Marland Kitchen.. 1po Once Daily 3)  Crestor 10 Mg  Tabs (Rosuvastatin Calcium) .Marland Kitchen.. 1po Once Daily 4)  Lisinopril 40 Mg Tabs (Lisinopril) .Marland Kitchen.. 1 By Mouth Once Daily 5)  Furosemide 20 Mg Tabs (Furosemide) .Marland Kitchen.. 1 By Mouth Once Daily As Needed Swelling 6)  Triamcinolone Acetonide 0.5 % Crea (Triamcinolone Acetonide) .... Use Asd Two Times A Day As Needed To Affected Areas  Current Medications (verified): 1)  Flomax 0.4 Mg  Cp24 (Tamsulosin Hcl) .Marland Kitchen.. 1 By Mouth Once Daily 2)  Levothyroxine Sodium 100 Mcg  Tabs (Levothyroxine Sodium) .Marland Kitchen.. 1po Once Daily 3)  Crestor 10 Mg  Tabs (Rosuvastatin Calcium) .Marland Kitchen.. 1po Once Daily 4)  Lisinopril 40 Mg Tabs (Lisinopril) .Marland Kitchen.. 1 By Mouth Once Daily 5)  Furosemide 40 Mg Tabs (Furosemide) .Marland KitchenMarland KitchenMarland Kitchen  1po Once Daily 6)  Triamcinolone Acetonide 0.5 % Crea (Triamcinolone Acetonide) .... Use Asd Two Times A Day As Needed To Affected Areas 7)  Klor-Con 10 10 Meq Cr-Tabs (Potassium Chloride) .Marland Kitchen.. 1po Once Daily  Allergies (verified): 1)  ! Lipitor 2)  ! Zocor 3)  ! 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   Head:  normocephalic and atraumatic.   Eyes:  vision grossly intact, pupils equal, and pupils round.   Ears:  R ear normal and L ear normal.   Nose:  no external deformity and no nasal discharge.   Mouth:  no gingival abnormalities and pharynx pink and moist.   Neck:  supple and no masses.   Lungs:  normal respiratory effort and normal breath sounds.   Heart:  normal rate and regular rhythm.   Abdomen:  soft, non-tender, and normal bowel sounds.   Msk:  no joint tenderness and no joint swelling.  including the knees bilat Extremities:  1-2+ LE edema to mid calf   Impression & Recommendations:  Problem # 1:  PERIPHERAL EDEMA (ICD-782.3)  His updated medication list for this problem includes:    Furosemide 40 Mg Tabs (Furosemide) .Marland Kitchen... 1po once daily unclear etiology for recent worsening - to check routine labs, ecg, cxr  and echo;  incr the lasix to 40 mg, add k 10 once daily, f/u next visit  Orders: EKG w/ Interpretation (93000) Echo Referral (Echo) TLB-BMP (Basic Metabolic Panel-BMET) (80048-METABOL) TLB-CBC Platelet - w/Differential (85025-CBCD) TLB-Hepatic/Liver Function Pnl (80076-HEPATIC) TLB-TSH (Thyroid Stimulating Hormone) (84443-TSH) TLB-Udip w/ Micro (81001-URINE) T-2 View CXR, Same Day (71020.5TC)  Problem # 2:  GLUCOSE INTOLERANCE (ICD-271.3)  to also check a1c, o/w asympt  Orders: TLB-A1C / Hgb A1C (Glycohemoglobin) (83036-A1C)  Problem # 3:  HYPERSOMNIA (ICD-780.54)  highly suggestive of OSA - for pulm referral - for further eval and tx  Orders: Pulmonary Referral (Pulmonary)  Problem # 4:  HYPERTENSION (ICD-401.9) Assessment: Unchanged  His updated medication list for this problem includes:    Lisinopril 40 Mg Tabs (Lisinopril) .Marland Kitchen... 1 by mouth once  daily    Furosemide 40 Mg Tabs (Furosemide) .Marland Kitchen... 1po once daily  BP today: 110/68 Prior BP: 120/90 (11/30/2009)  Prior 10 Yr Risk Heart Disease: 11 % (05/11/2009)  Labs Reviewed: K+: 4.6 (04/19/2009) Creat: : 1.3 (04/19/2009)   Chol: 121 (11/27/2009)   HDL: 42.50 (11/27/2009)   LDL: 59 (11/27/2009)   TG: 98.0 (11/27/2009) stable overall by hx and exam, ok to continue meds/tx as is   Complete Medication List: 1)  Flomax 0.4 Mg Cp24 (Tamsulosin hcl) .Marland Kitchen.. 1 by mouth once daily 2)  Levothyroxine Sodium 100 Mcg Tabs (Levothyroxine sodium) .Marland Kitchen.. 1po once daily 3)  Crestor 10 Mg Tabs (Rosuvastatin calcium) .Marland Kitchen.. 1po once daily 4)  Lisinopril 40 Mg Tabs (Lisinopril) .Marland Kitchen.. 1 by mouth once daily 5)  Furosemide 40 Mg Tabs (Furosemide) .Marland Kitchen.. 1po once daily 6)  Triamcinolone Acetonide 0.5 % Crea (Triamcinolone acetonide) .... Use asd two times a day as needed to affected areas 7)  Klor-con 10 10 Meq Cr-tabs (Potassium chloride) .Marland Kitchen.. 1po once daily  Patient Instructions: 1)  Please take all new medications as prescribed  - the lasix at 40 mg and the potassium pill - both sent to the pharmacy 2)  your EKG was ok today 3)  Please go to Radiology in the basement level for your X-Ray today  4)  Please go to the Lab in the basement for your blood and/or urine tests today 5)  Please call the number on the [facility] Card for results of your testing  6)  You will be contacted about the referral(s) to: Echocardiogram, adn the pulmonary referral for possible sleep apnea 7)  Please schedule a follow-up appointment in 3 months with CPX labs Prescriptions: KLOR-CON 10 10 MEQ CR-TABS (POTASSIUM CHLORIDE) 1po once daily  #30 x 11   Entered and Authorized by:   Corwin Levins MD   Signed by:   Corwin Levins MD on 02/20/2010   Method used:   Electronically to        [facility]  [address] (retail)       [address]       [place], [place]  [address]       Ph: [phone]       Fax: [phone]   RxID:    [id number] FUROSEMIDE 40 MG TABS (FUROSEMIDE) 1po once daily  #90 x 3   Entered and Authorized by:   Corwin Levins MD   Signed by:   Corwin Levins MD on 02/20/2010   Method used:   Electronically to        [facility]  [address] (retail)       [address]       [place], [place]  [address]       Ph: [phone]       Fax: [phone]   RxID:   [id number]

## 2010-07-10 NOTE — Assessment & Plan Note (Signed)
Summary: rov/sp      Allergies Added:     Lipid Clinic Visit      The patient comes in today for dyslipidemia follow-up.  The patient has no complaints of medication problems, chest pain, shortness of breath, muscle aches, or muscle cramps.  He is compliant with his Crestor 10mg  daily. His diet has not changed much since last visit. He is still eating cheerios and 2% milk for breakfast. For lunch he is eating sandwhiches (mostly [place] or ham). For dinner his wife cooks a lot of pasta. He is trying to eat lean meats such as grilled chicken. Patient states his problem comes with between meal snacks. He loves oatmeal cookies particulary and other sugary snacks. At one point he had lost 50 pounds but is now gaining it back slowly. Patient states he is 12 pounds away from being back to his weight before the 50 pound weight loss.   Lipid Management Provider  Lyna Poser PharmD, Earvin Hansen, PharmD, Samantha Crimes, PharmD  Current Medications (verified): 1)  Tamsulosin Hcl 0.4 Mg Caps (Tamsulosin Hcl) .Marland Kitchen.. 1po Once Daily 2)  Levothyroxine Sodium 100 Mcg  Tabs (Levothyroxine Sodium) .Marland Kitchen.. 1po Once Daily 3)  Crestor 10 Mg  Tabs (Rosuvastatin Calcium) .Marland Kitchen.. 1po Once Daily 4)  Lisinopril 40 Mg Tabs (Lisinopril) .Marland Kitchen.. 1 By Mouth Once Daily 5)  Furosemide 20 Mg Tabs (Furosemide) .Marland Kitchen.. 1 By Mouth Once Daily 6)  Klor-Con 10 10 Meq Cr-Tabs (Potassium Chloride) .Marland Kitchen.. 1po Once Daily 7)  Aspir-Low 81 Mg Tbec (Aspirin) .Marland Kitchen.. 1po Once Daily 8)  Indomethacin 50 Mg Caps (Indomethacin) .Marland Kitchen.. 1 By Mouth Three Times A Day As Needed Gout  Allergies (verified): 1)  ! Lipitor 2)  ! Zocor 3)  ! * Niaspan   Vital Signs:  Patient profile:   65 year old male Weight:      238 pounds Pulse rate:   64 / minute BP sitting:   140 / 90  (right arm)  Impression & Recommendations:  Problem # 1:  PURE HYPERCHOLESTEROLEMIA (ICD-272.0) Current levels TC 129, TG 135 up from 98 at previous visit, HDL 34 (down from 42.5),  LDL 67 (goal <70) increased from 59 at previous visit. Will continue crestor 10 mg daily. No changes to medicine. Counseled patient that diet and exercise are the key for him. Goal for him will be to control the weight gain. Encouraged him to go back to walking 2 miles around his neighborhood (which is how he lost weight before). Goal for him will be to walk 3x/week to start out with goal to work up to 30 minutes per day/ 5 x week. Patient is not exercising currently. For diet, encouraged him to cut out multiple in between meal snacks. Did not schedule return appointment. Patient is at goal except for HDL which will improve with exercise and diet if patient will adhere. Will return to MD care.  His updated medication list for this problem includes:    Crestor 10 Mg Tabs (Rosuvastatin calcium) .Marland Kitchen... 1po once daily  Patient Instructions: 1)  Continue taking crestor at current dose of 10 mg daily.  2)  Work on increasing the amount you exercise.  3)  Start out walking the 2 miles around your neighborhood 3x/week like you did when you lost 50 pounds. 4)  Goal exercise is  to work up to 30 minutes a day 5x/week.  5)  Limit your fatty foods. 6)  F/U with Dr. Jonny Ruiz in 6-12 months

## 2010-07-10 NOTE — Assessment & Plan Note (Signed)
Summary: rov-tp   Visit Type:  Follow-up  CC:  dyslipidemia follow-up.    Lipid Clinic Visit      The patient comes in today for dyslipidemia follow-up.  The patient has no complaints of medication problems, chest pain, shortness of breath, muscle aches, or muscle cramps.  He is compliant with his Crestor 10mg  daily.   Dietary compliance review reveals pt has made a large effort to decrease his sugar and fat intake.  He has cut down on snacking and decreased the number of diet sodas a day to  ~2.  His typical diet consists of cereal with 2 % milk for breakfast (only 3-4 eggs/week), salads or sandwiches for lunch (he does not go out to lunch often due to his work schedule) and a variable dinner.  His wife does cook the dinner meal but he usually sticks to chicken and fish more than pork or red meat due to gout.  Review of exercise habits reveals that the patient is not exercising as much lately.  He had been walking around the mall with his wife, but this has decreased due to a busier work schedule.  He has lost 5 lbs since last seen by Lipid clinic.    Lipid Management Provider  Weston Brass, PharmD  Current Medications (verified): 1)  Flomax 0.4 Mg  Cp24 (Tamsulosin Hcl) .Marland Kitchen.. 1 By Mouth Once Daily 2)  Levothyroxine Sodium 100 Mcg  Tabs (Levothyroxine Sodium) .Marland Kitchen.. 1po Once Daily 3)  Crestor 10 Mg  Tabs (Rosuvastatin Calcium) .Marland Kitchen.. 1po Once Daily 4)  Lisinopril 40 Mg Tabs (Lisinopril) .Marland Kitchen.. 1 By Mouth Once Daily 5)  Furosemide 20 Mg Tabs (Furosemide) .Marland Kitchen.. 1 By Mouth Once Daily As Needed Swelling 6)  Triamcinolone Acetonide 0.5 % Crea (Triamcinolone Acetonide) .... Use Asd Two Times A Day As Needed To Affected Areas  Allergies: 1)  ! Lipitor 2)  ! Zocor 3)  ! 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  (TC- 121, TG- 98, HDL- 42.5, LDL- 59).  AST and ALT are WNL.  Will continue his current therapy with Crestor 10mg .  Encouraged him to keep up his current diet and limit snacking as much as possible.  I have asked him to start exercising on a regular basis again so his HDL will remain at goal and he can continue losing weight.  Will recheck cholesterol with labwork for Dr. Jonny Ruiz (yearly physical in November).    His updated medication list for this problem includes:    Crestor 10 Mg Tabs (Rosuvastatin calcium) .Marland Kitchen... 1po once daily  Patient Instructions: 1)  Continue Crestor 10mg  daily 2)  Continue current diet.  Try to limit snacks at much as possible  3)  Increase Exercise to 30 minutes 2-3 times a week  4)  Lab appt: 11/1 at 7:30 at [facility] office 5)  Lipid Clinic Appt: 11/14 at 4pm

## 2010-07-10 NOTE — Assessment & Plan Note (Signed)
Summary: RASH---STC   Vital Signs:  Patient profile:   65 year old male Height:      69 inches Weight:      241.75 pounds BMI:     35.83 O2 Sat:      98 % on Room air Temp:     98.5 degrees F oral Pulse rate:   79 / minute BP sitting:   130 / 76  (left arm)  Vitals Entered By: Lucious Groves (July 25, 2009 4:08 PM)  O2 Flow:  Room air CC: C/O rash that began shortly after taking Allopurinol. It began on ankles, and is also now on pt knees and arms./kb Is Patient Diabetic? No Pain Assessment Patient in pain? no        CC:  C/O rash that began shortly after taking Allopurinol. It began on ankles and and is also now on pt knees and arms./kb.  History of Present Illness: rash started 2 wks after allopurinol ;  pruritic and discomforting but not painful; has TNTC small itchy erythema areas to extremities only, none to trunk or ENT;  no swelling or angioedema to skin, tongue or any else;  no sob or wheezing;  still taking the allopurinol but wanting to stop;  no recent gout or other joint problem while taking;  also on Lisinopril for several years without diffictuly such as cough or allergy and other meds as per EMR;  no other OTC med use, no other unusual or new or repeat exposures in the home , work or other environs.  No prior hx of hives or allergy rash.  Pt denies CP, sob, doe, wheezing, orthopnea, pnd, worsening LE edema, palps, dizziness or syncope   Pt denies new neuro symptoms such as headache, facial or extremity weakness   Overall good complacine with meds, no other complaints.   No fever.    Problems Prior to Update: 1)  Peripheral Edema  (ICD-782.3) 2)  Peripheral Edema  (ICD-782.3) 3)  Acute Gouty Arthropathy  (ICD-274.01) 4)  Encounter For Long-term Use of Other Medications  (ICD-V58.69) 5)  Pure Hypercholesterolemia  (ICD-272.0) 6)  Sinusitis- Acute-nos  (ICD-461.9) 7)  Preventive Health Care  (ICD-V70.0) 8)  Uri  (ICD-465.9) 9)  Conjunctivitis, Acute, Left   (ICD-372.00) 10)  Allergic Rhinitis  (ICD-477.9) 11)  Preventive Health Care  (ICD-V70.0) 12)  Glucose Intolerance  (ICD-271.3) 13)  Impaired Fasting Glucose  (ICD-790.21) 14)  Special Screening Malignant Neoplasm of Prostate  (ICD-V76.44) 15)  Routine General Medical Exam@health  Care Facl  (ICD-V70.0) 16)  Benign Prostatic Hypertrophy  (ICD-600.00) 17)  Anxiety  (ICD-300.00) 18)  Morbid Obesity  (ICD-278.01) 19)  Hypothyroidism  (ICD-244.9) 20)  Hyperlipidemia  (ICD-272.4) 21)  Gout  (ICD-274.9) 22)  Hypertension  (ICD-401.9)  Medications Prior to Update: 1)  Flomax 0.4 Mg  Cp24 (Tamsulosin Hcl) .Marland Kitchen.. 1 By Mouth Once Daily 2)  Levothyroxine Sodium 100 Mcg  Tabs (Levothyroxine Sodium) .Marland Kitchen.. 1po Once Daily 3)  Crestor 10 Mg  Tabs (Rosuvastatin Calcium) .Marland Kitchen.. 1po Once Daily 4)  Ecotrin Low Strength 81 Mg  Tbec (Aspirin) .Marland Kitchen.. 1 By Mouth Qd 5)  Lisinopril 40 Mg Tabs (Lisinopril) .Marland Kitchen.. 1 By Mouth Once Daily 6)  Indomethacin 50 Mg Caps (Indomethacin) .Marland Kitchen.. 1 By Mouth Three Times A Day As Needed 7)  Hydrochlorothiazide 12.5 Mg Caps (Hydrochlorothiazide) .Marland Kitchen.. 1 - 2 By Mouth Once Daily As Needed 8)  Prednisone 10 Mg Tabs (Prednisone) .... 4po Qd For 3days, Then 3po Qd For 3days, Then 2po Qd  For 3days, Then 1po Qd For 3 Days, Then Stop 9)  Hydrocodone-Acetaminophen 5-325 Mg Tabs (Hydrocodone-Acetaminophen) .Marland Kitchen.. 1 - 2 By Mouth Q 6 Hrs As Needed Pain 10)  Allopurinol 300 Mg Tabs (Allopurinol) .Marland Kitchen.. 1po Once Daily  Current Medications (verified): 1)  Flomax 0.4 Mg  Cp24 (Tamsulosin Hcl) .Marland Kitchen.. 1 By Mouth Once Daily 2)  Levothyroxine Sodium 100 Mcg  Tabs (Levothyroxine Sodium) .Marland Kitchen.. 1po Once Daily 3)  Crestor 10 Mg  Tabs (Rosuvastatin Calcium) .Marland Kitchen.. 1po Once Daily 4)  Lisinopril 40 Mg Tabs (Lisinopril) .Marland Kitchen.. 1 By Mouth Once Daily 5)  Furosemide 20 Mg Tabs (Furosemide) .Marland Kitchen.. 1 By Mouth Once Daily As Needed Swelling 6)  Prednisone 10 Mg Tabs (Prednisone) .... 4po Qd For 3days, Then 3po Qd For 3days, Then 2po Qd  For 3days, Then 1po Qd For 3 Days, Then Stop  Allergies (verified): 1)  ! Lipitor 2)  ! Zocor 3)  ! 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   Head:  normocephalic and atraumatic.   Eyes:  vision grossly intact, pupils equal, and pupils round.   Ears:  R ear normal and L ear normal.   Nose:  no external deformity and no nasal discharge.   Mouth:  no gingival abnormalities and pharynx pink and moist.  , no tongue swelling Neck:  supple and no masses.   Lungs:  normal respiratory effort and normal breath sounds.   Heart:  normal rate and regular rhythm.   Extremities:  no edema, no erythema  Skin:  macular rash.small lesions itchy to with few excoriations to extremities,  no angioedema   Impression & Recommendations:  Problem # 1:  RASH-NONVESICULAR (ICD-782.1) c/w allergic rash;  declines depo shot, for prednisone burst and taper off; to stop the allopurinol;  consider other med holiday such as the ACE;  d/w pt need to consier other exposures in the home, work or other;  benadryl otc as needed itch;  cont to monitor for any worsening s/s such as tongue swelling or sob  Problem # 2:  GOUT (ICD-274.9) consider change to uloric or other for gout if recurs in the future  Problem # 3:  PERIPHERAL EDEMA (ICD-782.3)  His updated medication list for this problem  includes:    Furosemide 20 Mg Tabs (Furosemide) .Marland Kitchen... 1 by mouth once daily as needed swelling change to furosemide, watch for any gout recurrence  Problem # 4:  HYPERTENSION (ICD-401.9)  His updated medication list for this problem includes:    Lisinopril 40 Mg Tabs (Lisinopril) .Marland Kitchen... 1 by mouth once daily    Furosemide 20 Mg Tabs (Furosemide) .Marland Kitchen... 1 by mouth once daily as needed swelling may increase off the hctz now, to follow closely at home, consider add amlodipine   Complete Medication List: 1)  Flomax 0.4 Mg Cp24 (Tamsulosin hcl) .Marland Kitchen.. 1 by mouth once daily 2)  Levothyroxine Sodium 100 Mcg Tabs (Levothyroxine sodium) .Marland Kitchen.. 1po once daily 3)  Crestor 10  Mg Tabs (Rosuvastatin calcium) .Marland Kitchen.. 1po once daily 4)  Lisinopril 40 Mg Tabs (Lisinopril) .Marland Kitchen.. 1 by mouth once daily 5)  Furosemide 20 Mg Tabs (Furosemide) .Marland Kitchen.. 1 by mouth once daily as needed swelling 6)  Prednisone 10 Mg Tabs (Prednisone) .... 4po qd for 3days, then 3po qd for 3days, then 2po qd for 3days, then 1po qd for 3 days, then stop  Patient Instructions: 1)  stop the allopurinol 2)  if the gout recurs, we will have to consider urloric instead 3)  Please take all new medications as prescribed - the prednisone 4)  stop the HCTZ fluid pill 5)  take the lasix 20 mg per day (it can be increased if the swelling is not controlled when taking it) 6)  Check your Blood Pressure regularly. If it is above 140/90, you may need a new blood pressure medication on top of what you take 7)  please return in November 2011 for your "yearly exam" with CPX labs, or sooner if needed Prescriptions: FUROSEMIDE 20 MG TABS (FUROSEMIDE) 1 by mouth once daily as needed swelling  #90 x 3   Entered and Authorized by:   Corwin Levins MD   Signed by:   Corwin Levins MD on 07/25/2009   Method used:   Print then Give to Patient   RxID:   [id number] PREDNISONE 10 MG TABS (PREDNISONE) 4po qd for 3days, then 3po qd for 3days, then 2po qd for 3days, then  1po qd for 3 days, then stop  #30 x 0   Entered and Authorized by:   Corwin Levins MD   Signed by:   Corwin Levins MD on 07/25/2009   Method used:   Print then Give to Patient   RxID:   [id number]

## 2010-07-10 NOTE — Assessment & Plan Note (Signed)
Summary: rov for review of sleep study.   Visit Type:  Follow-up Copy to:  Oliver Barre MD Primary Provider/Referring Provider:  Corwin Levins MD  CC:  pt here to discuss sleep study results. .  History of Present Illness: The pt comes in today for f/u of his recent sleep study.  He was found to have small numbers of obstructive events which do not meet the AHI criteria for the obstructive sleep apnea syndrome.  There was no other disruptions to sleep noted.  Current Medications (verified): 1)  Tamsulosin Hcl 0.4 Mg Caps (Tamsulosin Hcl) .Marland Kitchen.. 1po Once Daily 2)  Levothyroxine Sodium 100 Mcg  Tabs (Levothyroxine Sodium) .Marland Kitchen.. 1po Once Daily 3)  Crestor 10 Mg  Tabs (Rosuvastatin Calcium) .Marland Kitchen.. 1po Once Daily 4)  Lisinopril 40 Mg Tabs (Lisinopril) .Marland Kitchen.. 1 By Mouth Once Daily 5)  Furosemide 20 Mg Tabs (Furosemide) .Marland Kitchen.. 1 By Mouth Once Daily 6)  Klor-Con 10 10 Meq Cr-Tabs (Potassium Chloride) .Marland Kitchen.. 1po Once Daily 7)  Aspir-Low 81 Mg Tbec (Aspirin) .Marland Kitchen.. 1po Once Daily 8)  Indomethacin 50 Mg Caps (Indomethacin) .Marland Kitchen.. 1 By Mouth Three Times A Day As Needed Gout  Allergies (verified): 1)  ! Lipitor 2)  ! Zocor 3)  ! * Niaspan  Review of Systems       The patient complains of productive cough, nasal congestion/difficulty breathing through nose, and hand/feet swelling.  The patient denies shortness of breath with activity, shortness of breath at rest, non-productive cough, coughing up blood, chest pain, irregular heartbeats, acid heartburn, indigestion, loss of appetite, weight change, abdominal pain, difficulty swallowing, sore throat, tooth/dental problems, headaches, sneezing, itching, ear ache, anxiety, depression, joint stiffness or pain, rash, change in color of mucus, and fever.    Vital Signs:  Patient profile:   65 year old male Height:      69 inches Weight:      243.13 pounds O2 Sat:      100 % on Room air Temp:     98.3 degrees F oral Pulse rate:   64 / minute BP sitting:   126 / 70   (left arm) Cuff size:   large  Vitals Entered By: Carver Fila (April 26, 2010 4:08 PM)  O2 Flow:  Room air  Physical Exam  General:  ow male in nad Extremities:  mild pedal edema, no cyanosis  Neurologic:  alert and oriented, moves all 4 does not appear sleepy.   Impression & Recommendations:  Problem # 1:  HYPERSOMNIA (ICD-780.54)  the pt does not have clinically significant osa by his recent sleep study, but I do think he may have the UARS.  This is a pre-sleep apnea condition that can results in some sleep disruption and daytime sleepiness, but does not put the pt at increased risk for CV events.  I have asked the pt to work aggressively on weight loss, and to stay off his back while sleeping as much as possible.    Other Orders: Est. Patient Level III (16109)  Patient Instructions: 1)  work on weight reduction and positional therapy 2)  followup as needed.   Immunization History:  Influenza Immunization History:    Influenza:  historical (04/10/2010)

## 2010-07-10 NOTE — Progress Notes (Signed)
Summary: Rx refill req  Phone Note Refill Request Message from:  Patient on February 07, 2010 10:51 AM  Refills Requested: Medication #1:  LISINOPRIL 40 MG TABS 1 by mouth once daily   Dosage confirmed as above?Dosage Confirmed   Supply Requested: 9 months  Method Requested: Fax to [facility] Initial call taken by: Margaret Pyle, CMA,  February 07, 2010 10:52 AM    Prescriptions: LISINOPRIL 40 MG TABS (LISINOPRIL) 1 by mouth once daily  #90 x 2   Entered by:   Margaret Pyle, CMA   Authorized by:   Corwin Levins MD   Signed by:   Margaret Pyle, CMA on 02/07/2010   Method used:   Electronically to        [facility]* (retail)             ,          Ph: [phone]       Fax: [phone]   RxID:   [id number]

## 2010-07-10 NOTE — Miscellaneous (Signed)
Summary: Appointment No Show  Appointment status changed to no show by [employer] on 03/01/2010 10:22 AM.  No Show Comments ---------------- echo/peripheral/jml  Appointment Information ----------------------- Appt Type:  CARDIOLOGY ANCILLARY VISIT      Date:  Thursday, March 01, 2010      Time:  9:30 AM for 60 min   Urgency:  Routine   Made By:  Pearson Grippe  To Visit:  [id number]    Reason:  echo/peripheral/jml  Appt Comments ------------- -- 03/01/10 10:22: ([facility]) NO SHOW -- echo/peripheral/jml -- 02/21/10 11:57: ([facility]) BOOKED -- Routine CARDIOLOGY ANCILLARY VISIT at 03/01/2010 9:30 AM for 60 min echo/peripheral/jml

## 2010-07-10 NOTE — Assessment & Plan Note (Signed)
Summary: poison ivy,oak/cd   Vital Signs:  Patient profile:   65 year old male Height:      70 inches Weight:      239 pounds BMI:     34.42 O2 Sat:      97 % on Room air Temp:     98.2 degrees F oral Pulse rate:   70 / minute BP sitting:   122 / 80  (left arm) Cuff size:   large  Vitals Entered ByZella Ball Ewing (Oct 09, 2009 3:11 PM)  O2 Flow:  Room air CC: Poison ivy/RE   CC:  Poison ivy/RE.  History of Present Illness: here with 3 days worsening rash, seemed to start at the left arm and wrist area, then spread to the right wrist, then to the face ;  very itchy and no fever, pain.  Pt denies CP, sob, doe, wheezing, orthopnea, pnd, worsening LE edema, palps, dizziness or syncope  . Pt denies new neuro symptoms such as headache, facial or extremity weakness  Overall good complaince with meds and good tolerability.  Trying to cont to follow lower chol diet.  No hx of DM,  denies polydipsia and polyruria.    Problems Prior to Update: 1)  Rash-nonvesicular  (ICD-782.1) 2)  Rash-nonvesicular  (ICD-782.1) 3)  Peripheral Edema  (ICD-782.3) 4)  Peripheral Edema  (ICD-782.3) 5)  Acute Gouty Arthropathy  (ICD-274.01) 6)  Encounter For Long-term Use of Other Medications  (ICD-V58.69) 7)  Pure Hypercholesterolemia  (ICD-272.0) 8)  Sinusitis- Acute-nos  (ICD-461.9) 9)  Preventive Health Care  (ICD-V70.0) 10)  Uri  (ICD-465.9) 11)  Conjunctivitis, Acute, Left  (ICD-372.00) 12)  Allergic Rhinitis  (ICD-477.9) 13)  Preventive Health Care  (ICD-V70.0) 14)  Glucose Intolerance  (ICD-271.3) 15)  Impaired Fasting Glucose  (ICD-790.21) 16)  Special Screening Malignant Neoplasm of Prostate  (ICD-V76.44) 17)  Routine General Medical Exam@health  Care Facl  (ICD-V70.0) 18)  Benign Prostatic Hypertrophy  (ICD-600.00) 19)  Anxiety  (ICD-300.00) 20)  Morbid Obesity  (ICD-278.01) 21)  Hypothyroidism  (ICD-244.9) 22)  Hyperlipidemia  (ICD-272.4) 23)  Gout  (ICD-274.9) 24)  Hypertension   (ICD-401.9)  Medications Prior to Update: 1)  Flomax 0.4 Mg  Cp24 (Tamsulosin Hcl) .Marland Kitchen.. 1 By Mouth Once Daily 2)  Levothyroxine Sodium 100 Mcg  Tabs (Levothyroxine Sodium) .Marland Kitchen.. 1po Once Daily 3)  Crestor 10 Mg  Tabs (Rosuvastatin Calcium) .Marland Kitchen.. 1po Once Daily 4)  Lisinopril 40 Mg Tabs (Lisinopril) .Marland Kitchen.. 1 By Mouth Once Daily 5)  Furosemide 20 Mg Tabs (Furosemide) .Marland Kitchen.. 1 By Mouth Once Daily As Needed Swelling 6)  Prednisone 10 Mg Tabs (Prednisone) .... 4po Qd For 3days, Then 3po Qd For 3days, Then 2po Qd For 3days, Then 1po Qd For 3 Days, Then Stop  Current Medications (verified): 1)  Flomax 0.4 Mg  Cp24 (Tamsulosin Hcl) .Marland Kitchen.. 1 By Mouth Once Daily 2)  Levothyroxine Sodium 100 Mcg  Tabs (Levothyroxine Sodium) .Marland Kitchen.. 1po Once Daily 3)  Crestor 10 Mg  Tabs (Rosuvastatin Calcium) .Marland Kitchen.. 1po Once Daily 4)  Lisinopril 40 Mg Tabs (Lisinopril) .Marland Kitchen.. 1 By Mouth Once Daily 5)  Furosemide 20 Mg Tabs (Furosemide) .Marland Kitchen.. 1 By Mouth Once Daily As Needed Swelling 6)  Prednisone 10 Mg Tabs (Prednisone) .... 3po Qd For 3days, Then 2po Qd For 3days, Then 1po Qd For 3days, Then Stop 7)  Triamcinolone Acetonide 0.5 % Crea (Triamcinolone Acetonide) .... Use Asd Two Times A Day As Needed To Affected Areas  Allergies (verified): 1)  !  Lipitor 2)  ! Zocor 3)  ! * Niaspan  Past History:  Past Medical History: Last updated: 10/07/2007 Hypertension Gout Hyperlipidemia Hypothyroidism Obesity Anxiety Benign prostatic hypertrophy Glucose intolerance Allergic rhinitis  Past Surgical History: Last updated: 03/04/2007 Denies surgical history  Social History: Last updated: 04/25/2008 Never Smoked Alcohol use-no Occupation: cust service Married 1 son   Risk Factors: Smoking Status: never (04/23/2007)  Review of Systems       all otherwise negative per pt -    Physical Exam  General:  alert and overweight-appearing.   Head:  normocephalic and atraumatic.   Eyes:  vision grossly intact, pupils equal,  and pupils round.   Ears:  R ear normal and L ear normal.   Nose:  no external deformity.   Mouth:  no gingival abnormalities and pharynx pink and moist.   Neck:  supple and no masses.   Lungs:  normal respiratory effort and normal breath sounds.   Heart:  normal rate and regular rhythm.   Extremities:  no edema, no erythema  Skin:  typical contact derm type rash to the left hand and wrist, right arm anterior, and mult small areas to the nose and left cheek   Impression & Recommendations:  Problem # 1:  RASH-NONVESICULAR (ICD-782.1)  c/we contact derm - for depo shot today, prednisone burst and taper off, and triam cr as needed   His updated medication list for this problem includes:    Triamcinolone Acetonide 0.5 % Crea (Triamcinolone acetonide) ..... Use asd two times a day as needed to affected areas  Orders: Depo- Medrol 40mg  (J1030) Depo- Medrol 80mg  (J1040) Admin of Therapeutic Inj  intramuscular or subcutaneous (16109)  Problem # 2:  PERIPHERAL EDEMA (ICD-782.3)  His updated medication list for this problem includes:    Furosemide 20 Mg Tabs (Furosemide) .Marland Kitchen... 1 by mouth once daily as needed swelling stable overall by hx and exam, ok to continue meds/tx as is   Problem # 3:  HYPERTENSION (ICD-401.9)  His updated medication list for this problem includes:    Lisinopril 40 Mg Tabs (Lisinopril) .Marland Kitchen... 1 by mouth once daily    Furosemide 20 Mg Tabs (Furosemide) .Marland Kitchen... 1 by mouth once daily as needed swelling  BP today: 122/80 Prior BP: 130/76 (07/25/2009)  Prior 10 Yr Risk Heart Disease: 11 % (05/11/2009)  Labs Reviewed: K+: 4.6 (04/19/2009) Creat: : 1.3 (04/19/2009)   Chol: 127 (04/19/2009)   HDL: 31.50 (04/19/2009)   LDL: 62 (04/19/2009)   TG: 167.0 (04/19/2009) stable overall by hx and exam, ok to continue meds/tx as is   Complete Medication List: 1)  Flomax 0.4 Mg Cp24 (Tamsulosin hcl) .Marland Kitchen.. 1 by mouth once daily 2)  Levothyroxine Sodium 100 Mcg Tabs  (Levothyroxine sodium) .Marland Kitchen.. 1po once daily 3)  Crestor 10 Mg Tabs (Rosuvastatin calcium) .Marland Kitchen.. 1po once daily 4)  Lisinopril 40 Mg Tabs (Lisinopril) .Marland Kitchen.. 1 by mouth once daily 5)  Furosemide 20 Mg Tabs (Furosemide) .Marland Kitchen.. 1 by mouth once daily as needed swelling 6)  Prednisone 10 Mg Tabs (Prednisone) .... 3po qd for 3days, then 2po qd for 3days, then 1po qd for 3days, then stop 7)  Triamcinolone Acetonide 0.5 % Crea (Triamcinolone acetonide) .... Use asd two times a day as needed to affected areas  Patient Instructions: 1)  you had the steroid shot today 2)  Please take all new medications as prescribed 3)  Continue all previous medications as before this visit  4)  Please schedule a follow-up appointment  in 6 months with CPX labs, or sooner if needed Prescriptions: TRIAMCINOLONE ACETONIDE 0.5 % CREA (TRIAMCINOLONE ACETONIDE) use asd two times a day as needed to affected areas  #1 x 1   Entered and Authorized by:   Corwin Levins MD   Signed by:   Corwin Levins MD on 10/09/2009   Method used:   Print then Give to Patient   RxID:   [id number] PREDNISONE 10 MG TABS (PREDNISONE) 3po qd for 3days, then 2po qd for 3days, then 1po qd for 3days, then stop  #18 x 0   Entered and Authorized by:   Corwin Levins MD   Signed by:   Corwin Levins MD on 10/09/2009   Method used:   Print then Give to Patient   RxID:   [id number]    Medication Administration  Injection # 1:    Medication: Depo- Medrol 40mg     Diagnosis: RASH-NONVESICULAR (ICD-782.1)    Route: IM    Site: RUOQ gluteus    Exp Date: 04/2012    Lot #: [id number]    Mfr: [employer]    Given by: Zella Ball Ewing (Oct 09, 2009 3:32 PM)  Injection # 2:    Medication: Depo- Medrol 80mg     Diagnosis: RASH-NONVESICULAR (ICD-782.1)    Route: IM    Site: RUOQ gluteus    Exp Date: 04/2012    Lot #: [id number]    Mfr: [employer]    Given by: Zella Ball Ewing (Oct 09, 2009 3:32 PM)  Orders Added: 1)  Depo- Medrol 40mg  [J1030] 2)  Depo- Medrol 80mg   [J1040] 3)  Admin of Therapeutic Inj  intramuscular or subcutaneous [96372] 4)  Est. Patient Level IV [64403]

## 2010-07-10 NOTE — Letter (Signed)
Summary: Custom - Lipid  [facility], Main Office  [address]   [place], [place] [address]   Phone: [phone]  Fax: [phone]     November 28, 2009 MRN: [id number]   Luis Myers [address] [place], [place]  [address]   Dear Luis Myers,  We have reviewed your cholesterol results.  They are as follows:     Total Cholesterol:    121 (Desirable: less than 200)       HDL  Cholesterol:     42.50  (Desirable: greater than 40 for men and 50 for women)       LDL Cholesterol:       59  (Desirable: less than 100 for low risk and less than 70 for moderate to high risk)       Triglycerides:       98.0  (Desirable: less than 150)  Our recommendations include:  Looks good, continue current medications   Call our office at the number listed above if you have any questions.  Lowering your LDL cholesterol is important, but it is only one of a large number of "risk factors" that may indicate that you are at risk for heart disease, stroke or other complications of hardening of the arteries.  Other risk factors include:   A.  Cigarette Smoking* B.  High Blood Pressure* C.  Obesity* D.   Low HDL Cholesterol (see yours above)* E.   Diabetes Mellitus (higher risk if your is uncontrolled) F.  Family history of premature heart disease G.  Previous history of stroke or cardiovascular disease    *These are risk factors YOU HAVE CONTROL OVER.  For more information, visit .  There is now evidence that lowering the TOTAL CHOLESTEROL AND LDL CHOLESTEROL can reduce the risk of heart disease.  The American Heart Association recommends the following guidelines for the treatment of elevated cholesterol:  1.  If there is now current heart disease and less than two risk factors, TOTAL CHOLESTEROL should be less than 200 and LDL CHOLESTEROL should be less than 100. 2.  If there is current heart disease or two or more risk factors, TOTAL CHOLESTEROL should be less than 200 and LDL  CHOLESTEROL should be less than 70.  A diet low in cholesterol, saturated fat, and calories is the cornerstone of treatment for elevated cholesterol.  Cessation of smoking and exercise are also important in the management of elevated cholesterol and preventing vascular disease.  Studies have shown that 30 to 60 minutes of physical activity most days can help lower blood pressure, lower cholesterol, and keep your weight at a healthy level.  Drug therapy is used when cholesterol levels do not respond to therapeutic lifestyle changes (smoking cessation, diet, and exercise) and remains unacceptably high.  If medication is started, it is important to have you levels checked periodically to evaluate the need for further treatment options.  Thank you,    [employer] Team

## 2010-07-10 NOTE — Assessment & Plan Note (Signed)
Summary: 6 mos f/u # / cd   Vital Signs:  Patient profile:   65 year old male Height:      69 inches Weight:      239 pounds BMI:     35.42 O2 Sat:      97 % on Room air Temp:     98.2 degrees F oral Pulse rate:   72 / minute BP sitting:   100 / 68  (left arm) Cuff size:   large  Vitals Entered By: Zella Ball Ewing CMA Duncan Dull) (April 17, 2010 2:40 PM)  O2 Flow:  Room air  Preventive Care Screening     had the flu shot at work 3 wks ago  CC: 6 month ROV/RE   Primary Care Provider:  Corwin Levins MD  CC:  6 month ROV/RE.  History of Present Illness: here for wellness, overall doing well;  Pt denies CP, worsening sob, doe, wheezing, orthopnea, pnd, worsening LE edema, palps, dizziness or syncope  Pt denies new neuro symptoms such as headache, facial or extremity weakness  Pt denies polydipsia, polyuria  Overall good compliance with meds, trying to follow low chol, DM diet, wt stable, little excercise however .  No fever, wt loss, night sweats, loss of appetite or other constitutional symptoms  Denies worsening depressive symptoms, suicidal ideation, or panic.   Pt states good ability with ADL's, low fall risk, home safety reviewed and adequate, no significant change in hearing or vision, trying to follow lower chol diet, and occasionally active only with regular excercise.   Preventive Screening-Counseling & Management      Drug Use:  no.    Problems Prior to Update: 1)  Hypersomnia  (ICD-780.54) 2)  Rash-nonvesicular  (ICD-782.1) 3)  Rash-nonvesicular  (ICD-782.1) 4)  Peripheral Edema  (ICD-782.3) 5)  Peripheral Edema  (ICD-782.3) 6)  Acute Gouty Arthropathy  (ICD-274.01) 7)  Encounter For Long-term Use of Other Medications  (ICD-V58.69) 8)  Pure Hypercholesterolemia  (ICD-272.0) 9)  Sinusitis- Acute-nos  (ICD-461.9) 10)  Preventive Health Care  (ICD-V70.0) 11)  Uri  (ICD-465.9) 12)  Conjunctivitis, Acute, Left  (ICD-372.00) 13)  Allergic Rhinitis  (ICD-477.9) 14)   Preventive Health Care  (ICD-V70.0) 15)  Glucose Intolerance  (ICD-271.3) 16)  Impaired Fasting Glucose  (ICD-790.21) 17)  Special Screening Malignant Neoplasm of Prostate  (ICD-V76.44) 18)  Routine General Medical Exam@[facility]  (ICD-V70.0) 19)  Benign Prostatic Hypertrophy  (ICD-600.00) 20)  Anxiety  (ICD-300.00) 21)  Morbid Obesity  (ICD-278.01) 22)  Hypothyroidism  (ICD-244.9) 23)  Hyperlipidemia  (ICD-272.4) 24)  Gout  (ICD-274.9) 25)  Hypertension  (ICD-401.9)  Medications Prior to Update: 1)  Flomax 0.4 Mg  Cp24 (Tamsulosin Hcl) .Marland Kitchen.. 1 By Mouth Once Daily 2)  Levothyroxine Sodium 100 Mcg  Tabs (Levothyroxine Sodium) .Marland Kitchen.. 1po Once Daily 3)  Crestor 10 Mg  Tabs (Rosuvastatin Calcium) .Marland Kitchen.. 1po Once Daily 4)  Lisinopril 40 Mg Tabs (Lisinopril) .Marland Kitchen.. 1 By Mouth Once Daily 5)  Furosemide 20 Mg Tabs (Furosemide) .... Once Daily 6)  Klor-Con 10 10 Meq Cr-Tabs (Potassium Chloride) .Marland Kitchen.. 1po Once Daily  Current Medications (verified): 1)  Tamsulosin Hcl 0.4 Mg Caps (Tamsulosin Hcl) .Marland Kitchen.. 1po Once Daily 2)  Levothyroxine Sodium 100 Mcg  Tabs (Levothyroxine Sodium) .Marland Kitchen.. 1po Once Daily 3)  Crestor 10 Mg  Tabs (Rosuvastatin Calcium) .Marland Kitchen.. 1po Once Daily 4)  Lisinopril 40 Mg Tabs (Lisinopril) .Marland Kitchen.. 1 By Mouth Once Daily 5)  Furosemide 20 Mg Tabs (Furosemide) .Marland Kitchen.. 1 By  Mouth Once Daily 6)  Klor-Con 10 10 Meq Cr-Tabs (Potassium Chloride) .Marland Kitchen.. 1po Once Daily 7)  Aspir-Low 81 Mg Tbec (Aspirin) .Marland Kitchen.. 1po Once Daily 8)  Indomethacin 50 Mg Caps (Indomethacin) .Marland Kitchen.. 1 By Mouth Three Times A Day As Needed Gout  Allergies (verified): 1)  ! Lipitor 2)  ! Zocor 3)  ! 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  Drug use-no  Risk Factors: Smoking Status: never (04/23/2007)  Social History: Never Smoked Alcohol use-no 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         all otherwise negative per pt -    Physical Exam  General:  alert and overweight-appearing.   Head:  normocephalic and atraumatic.   Eyes:  vision grossly intact, pupils equal, and pupils round.   Ears:  R ear normal and L ear normal.   Nose:  no external deformity and no nasal discharge.   Mouth:  no gingival abnormalities and pharynx pink and moist.   Neck:  supple and no masses.   Lungs:  normal respiratory effort and normal breath sounds.   Heart:  normal rate and regular rhythm.   Abdomen:  soft, non-tender, and normal bowel sounds.   Msk:  no joint tenderness and no joint swelling.  including the knees bilat Extremities:  trace edema left, none on the right , no ulcers  Neurologic:  cranial nerves II-XII intact and strength normal in all extremities.   Skin:  color normal and no rashes.   Psych:  not anxious appearing and not depressed appearing.     Impression & Recommendations:  Problem # 1:  Preventive Health Care (ICD-V70.0) Overall doing well, age appropriate education and counseling updated,  referral for preventive services and immunizations addressed, dietary counseling and smoking status adressed , most recent labs reviewed I have personally reviewed and have noted 1.The patient's medical and social history 2.Their use of alcohol, tobacco or illicit drugs 3.Their current medications and supplements 4. Functional ability including ADL's, fall risk, home safety risk, hearing & visual impairment 5.Diet and physical activities 6.Evidence for depression or mood disorders The patients weight, height, BMI  have been recorded in the chart I have made referrals, counseling and provided education to the patient based review of the above   Problem # 2:  PERIPHERAL EDEMA (ICD-782.3)  His updated medication list for this problem includes:    Furosemide 20 Mg Tabs (Furosemide) .Marland Kitchen... 1 by mouth once daily persistetn , ? venous insuff vs other ; w/u to date unrevealing - for echo  Orders: Echo Referral (Echo)  Problem # 3:  HYPERTENSION (ICD-401.9)  His updated medication list for this problem includes:    Lisinopril 40 Mg Tabs (Lisinopril) .Marland Kitchen... 1 by mouth once daily  Furosemide 20 Mg Tabs (Furosemide) .Marland Kitchen... 1 by mouth once daily  BP today: 100/68 Prior BP: 122/78 (03/08/2010)  Prior 10 Yr Risk Heart Disease: 11 % (05/11/2009)  Labs Reviewed: K+: 5.1 (04/10/2010) Creat: : 1.3 (04/10/2010)   Chol: 129 (04/10/2010)   HDL: 34.70 (04/10/2010)   LDL: 67 (04/10/2010)   TG: 135.0 (04/10/2010) stable overall by hx and exam, ok to continue meds/tx as is   Complete Medication List: 1)  Tamsulosin Hcl 0.4 Mg Caps (Tamsulosin hcl) .Marland Kitchen.. 1po once daily 2)  Levothyroxine Sodium 100 Mcg Tabs (Levothyroxine sodium) .Marland Kitchen.. 1po once daily 3)  Crestor 10 Mg Tabs (Rosuvastatin calcium) .Marland Kitchen.. 1po once daily 4)  Lisinopril 40 Mg Tabs (Lisinopril) .Marland Kitchen.. 1 by mouth once daily 5)  Furosemide 20 Mg Tabs (Furosemide) .Marland Kitchen.. 1 by mouth once daily 6)  Klor-con 10 10 Meq Cr-tabs (Potassium chloride) .Marland Kitchen.. 1po  once daily 7)  Aspir-low 81 Mg Tbec (Aspirin) .Marland Kitchen.. 1po once daily 8)  Indomethacin 50 Mg Caps (Indomethacin) .Marland Kitchen.. 1 by mouth three times a day as needed gout  Anticoagulation Management Assessment/Plan:             Patient Instructions: 1)  You will be contacted about the referral(s) to: echocardiogram 2)  Continue all previous medications as before this visit 3)  Please call Dr Teddy Spike office in 1-2 wks if you have not heard about the sleep study 4)  You are given the refills today 5)  Please schedule a follow-up appointment in 1 year., or sooner if needed Prescriptions: INDOMETHACIN 50 MG CAPS (INDOMETHACIN) 1 by mouth three times a day as needed gout  #90 x 3   Entered and Authorized by:   Corwin Levins MD   Signed by:   Corwin Levins MD on 04/17/2010   Method used:   Print then Give to Patient   RxID:   [id number] KLOR-CON 10 10 MEQ CR-TABS (POTASSIUM CHLORIDE) 1po once daily  #90 x 3   Entered and Authorized by:   Corwin Levins MD   Signed by:   Corwin Levins MD on 04/17/2010   Method used:   Print then Give to Patient   RxID:   [id number] FUROSEMIDE 20 MG TABS (FUROSEMIDE) 1 by mouth once daily  #90 x 3   Entered and Authorized by:   Corwin Levins MD   Signed by:   Corwin Levins MD on 04/17/2010   Method used:   Print then Give to Patient   RxID:   [id number] LISINOPRIL 40 MG TABS (LISINOPRIL) 1 by mouth once daily  #90 x 3   Entered and Authorized by:   Corwin Levins MD   Signed by:   Corwin Levins MD on 04/17/2010   Method used:   Print then Give to Patient   RxID:   [id number] CRESTOR 10 MG  TABS (ROSUVASTATIN CALCIUM) 1po once daily  #30 x 11   Entered and Authorized by:   Corwin Levins MD   Signed by:   Corwin Levins MD on 04/17/2010   Method used:   Print then Give to Patient   RxID:   [id number] CRESTOR 10 MG  TABS (ROSUVASTATIN CALCIUM) 1po once daily  #90 x 3   Entered and Authorized by:   Corwin Levins MD   Signed by:   Corwin Levins MD on  04/17/2010   Method used:   Print then Give to Patient   RxID:   [id number]  LEVOTHYROXINE SODIUM 100 MCG  TABS (LEVOTHYROXINE SODIUM) 1po once daily  #90 x 3   Entered and Authorized by:   Corwin Levins MD   Signed by:   Corwin Levins MD on 04/17/2010   Method used:   Print then Give to Patient   RxID:   [id number] TAMSULOSIN HCL 0.4 MG CAPS (TAMSULOSIN HCL) 1po once daily  #90 x 3   Entered and Authorized by:   Corwin Levins MD   Signed by:   Corwin Levins MD on 04/17/2010   Method used:   Print then Give to Patient   RxID:   [id number]    Orders Added: 1)  Echo Referral [Echo] 2)  Est. Patient 40-64 years [44010]

## 2010-07-10 NOTE — Assessment & Plan Note (Signed)
Summary: consult for osa   Visit Type:  Initial Consult Copy to:  Oliver Barre MD Primary Provider/Referring Provider:  Corwin Levins MD  CC:  snoring and sleepiness.  History of Present Illness: The pt is a 65y/o male who I have been asked to see for possible osa.  He has been noted to have loud snoring during sleep, as well as an abnormal breathing pattern with pauses.  He goes to bed btw 10-11pm, and arises at 4:30 am to start his day.  He thinks that he rested initially upon arising.  He states that he does develop sleepiness in the afternoons, and can fall asleep if he sit down.  He does have some sleep pressure while at work with working on the computer, and will get up and walk around.  In the evenings, he can doze while watching tv.  He denies any sleepiness while driving.  His epworth score today is 6.  He feels that his weight is neutral over the last 2 years.  Current Medications (verified): 1)  Flomax 0.4 Mg  Cp24 (Tamsulosin Hcl) .Marland Kitchen.. 1 By Mouth Once Daily 2)  Levothyroxine Sodium 100 Mcg  Tabs (Levothyroxine Sodium) .Marland Kitchen.. 1po Once Daily 3)  Crestor 10 Mg  Tabs (Rosuvastatin Calcium) .Marland Kitchen.. 1po Once Daily 4)  Lisinopril 40 Mg Tabs (Lisinopril) .Marland Kitchen.. 1 By Mouth Once Daily 5)  Furosemide 20 Mg Tabs (Furosemide) .... Once Daily 6)  Klor-Con 10 10 Meq Cr-Tabs (Potassium Chloride) .Marland Kitchen.. 1po Once Daily  Allergies (verified): 1)  ! Lipitor 2)  ! Zocor 3)  ! 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 Hypertension Gout Hyperlipidemia Hypothyroidism Obesity Anxiety Benign prostatic hypertrophy Glucose intolerance Allergic rhinitis  Past Surgical History: Reviewed history from 03/04/2007 and no changes required. Denies surgical history  Family History: Reviewed history from 04/25/2008 and no changes  required. Family History Lung cancer - father mother with parkinsons several maternal side with DM Heart disease--father,brother  Social History: Reviewed history from 04/25/2008 and no changes required. 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  The patient denies shortness of breath with activity, shortness of breath at rest, productive cough, non-productive cough, coughing up blood, chest pain, irregular heartbeats, indigestion, loss of appetite, weight change, abdominal pain, difficulty swallowing, sore throat, tooth/dental problems, headaches, nasal congestion/difficulty breathing through nose, itching, ear ache, anxiety, depression, rash, change in color of mucus, and fever.    Vital Signs:  Patient profile:   65 year old male Height:      69 inches Weight:      239.25 pounds O2 Sat:      97 % on Room air Temp:     98.4 degrees F oral Pulse rate:   88 / minute BP sitting:   122 / 78  (right arm) Cuff size:   regular  Vitals Entered By: Carver Fila (March 08, 2010 10:22 AM)  O2 Flow:  Room air CC: snoring and sleepiness Comments meds and allergies updated Phone number updated Carver Fila  March 08, 2010 10:22 AM    Physical Exam  General:  obese male in nad Eyes:  PERRLA and EOMI.   Nose:  mild septal deviation to  left with narrowing Mouth:  mild elongation of soft palate and uvula mild tonsillar hypertrophy Neck:  no jvd, tmg, LN Lungs:  clear to auscultation Heart:  rrr,no mrg Abdomen:  soft and nontender, bs+ Extremities:  1+ pedal/ankle edema, no cyanosis  pulses intact distally Neurologic:  alert and oriented,moves all 4   Impression & Recommendations:  Problem # 1:  HYPERSOMNIA (ICD-780.54) the pt has daytime hypersomnia with a history that is very suspicious for osa.  I have had a long  discussion with him about sleep apnea, including its impact on his QOL and CV health.  I think he needs to have a sleep study done for diagnosis, and will see him back to discuss the results.  Medications Added to Medication List This Visit: 1)  Furosemide 20 Mg Tabs (Furosemide) .... Once daily  Other Orders: Consultation Level IV (36644) Sleep Disorder Referral (Sleep Disorder)  Patient Instructions: 1)  will schedule for sleep study, and arrange followup when results are available. 2)  work on weight loss   Immunization History:  Influenza Immunization History:    Influenza:  historical (03/19/2009)  Pneumovax Immunization History:    Pneumovax:  historical (03/19/2009)

## 2010-07-10 NOTE — Progress Notes (Signed)
Summary: pt needs ov with kc next wk slp study f/u-pt returned call  Phone Note Outgoing Call   Call placed by: Carver Fila,  April 19, 2010 9:40 AM Call placed to: Patient Summary of Call: lm with wife to give our office a call back. Pt needs to see kc next week and per kc it is okay to use his rn slot one wed, thur, fri. to discuss sleep study results.  Carver Fila  April 19, 2010 9:40 AM   Follow-up for Phone Call        pt returned call. i scheduled him for f/u next thurs 11/17 at 4pm . Tivis Ringer, CNA  April 19, 2010 10:02 AM

## 2010-07-12 NOTE — Letter (Signed)
Summary: [facility]  [facility]   Imported By: Lennie Odor 07/05/2010 09:42:37  _____________________________________________________________________  External Attachment:    Type:   Image     Comment:   External Document

## 2010-07-12 NOTE — Assessment & Plan Note (Signed)
Summary: DISCUSS LABS DONE AT WORK/[facility]  #   Vital Signs:  Patient profile:   65 year old male Height:      70 inches Weight:      238.25 pounds BMI:     34.31 O2 Sat:      97 % on Room air Temp:     98.7 degrees F oral Pulse rate:   62 / minute BP sitting:   102 / 64  (left arm) Cuff size:   large  Vitals Entered By: Zella Ball Ewing CMA Duncan Dull) (June 20, 2010 3:44 PM)  O2 Flow:  Room air CC: Discuss Labs results/RE   Primary Care Provider:  Corwin Levins MD  CC:  Discuss Labs results/RE.  History of Present Illness: pt here to d/w me recent labs done per NP at work as a work related benefit;  pt had mentioned fatigue and therefore routine labs as well as testosterone. vit d were checked;  was found to have relatively low total testosterone at 249, but normal free testosterone - and prescribed testosterone injections;  pt has hx of BPH, normal erectile function and wanted to know if I agreed with this approach;  also had low Vit D (16, normal > 30) and given rx for 50K vit d weekly for 12 wks;  also wanted to review recent echo -whjich did show normal EF but LVH by echo, grade 1 diast dysfunction, and bilat atrial dilation.  Overall good compliance with meds, and good tolerability.  Pt denies CP, worsening sob, doe, wheezing, orthopnea, pnd, worsening LE edema, palps, dizziness or syncope  Pt denies new neuro symptoms such as headache, facial or extremity weakness  Pt denies polydipsia, polyuria   Overall good compliance with meds, trying to follow low chol diet, wt stable, little excercise however .    Problems Prior to Update: 1)  Hypersomnia  (ICD-780.54) 2)  Rash-nonvesicular  (ICD-782.1) 3)  Rash-nonvesicular  (ICD-782.1) 4)  Peripheral Edema  (ICD-782.3) 5)  Peripheral Edema  (ICD-782.3) 6)  Acute Gouty Arthropathy  (ICD-274.01) 7)  Encounter For Long-term Use of Other Medications  (ICD-V58.69) 8)  Pure Hypercholesterolemia  (ICD-272.0) 9)  Sinusitis- Acute-nos   (ICD-461.9) 10)  Preventive Health Care  (ICD-V70.0) 11)  Uri  (ICD-465.9) 12)  Conjunctivitis, Acute, Left  (ICD-372.00) 13)  Allergic Rhinitis  (ICD-477.9) 14)  Preventive Health Care  (ICD-V70.0) 15)  Glucose Intolerance  (ICD-271.3) 16)  Impaired Fasting Glucose  (ICD-790.21) 17)  Special Screening Malignant Neoplasm of Prostate  (ICD-V76.44) 18)  Routine General Medical Exam@health  Care Fac[facility]  (ICD-V70.0) 19)  Benign Prostatic Hypertrophy  (ICD-600.00) 20)  Anxiety  (ICD-300.00) 21)  Morbid Obesity  (ICD-278.01) 22)  Hypothyroidism  (ICD-244.9) 23)  Hyperlipidemia  (ICD-272.4) 24)  Gout  (ICD-274.9) 25)  Hypertension  (ICD-401.9)  Medications Prior to Update: 1)  Tamsulosin Hcl 0.4 Mg Caps (Tamsulosin Hcl) .Marland Kitchen.. 1po Once Daily 2)  Levothyroxine Sodium 100 Mcg  Tabs (Levothyroxine Sodium) .Marland Kitchen.. 1po Once Daily 3)  Crestor 10 Mg  Tabs (Rosuvastatin Calcium) .Marland Kitchen.. 1po Once Daily 4)  Lisinopril 40 Mg Tabs (Lisinopril) .Marland Kitchen.. 1 By Mouth Once Daily 5)  Furosemide 20 Mg Tabs (Furosemide) .Marland Kitchen.. 1 By Mouth Once Daily 6)  Klor-Con 10 10 Meq Cr-Tabs (Potassium Chloride) .Marland Kitchen.. 1po Once Daily 7)  Aspir-Low 81 Mg Tbec (Aspirin) .Marland Kitchen.. 1po Once Daily 8)  Indomethacin 50 Mg Caps (Indomethacin) .Marland Kitchen.. 1 By Mouth Three Times A Day As Needed Gout  Current Medications (verified): 1)  Tamsulosin Hcl  0.4 Mg Caps (Tamsulosin Hcl) .Marland Kitchen.. 1po Once Daily 2)  Levothyroxine Sodium 100 Mcg  Tabs (Levothyroxine Sodium) .Marland Kitchen.. 1po Once Daily 3)  Crestor 10 Mg  Tabs (Rosuvastatin Calcium) .Marland Kitchen.. 1po Once Daily 4)  Lisinopril 40 Mg Tabs (Lisinopril) .Marland Kitchen.. 1 By Mouth Once Daily 5)  Furosemide 20 Mg Tabs (Furosemide) .Marland Kitchen.. 1 By Mouth Once Daily 6)  Klor-Con 10 10 Meq Cr-Tabs (Potassium Chloride) .Marland Kitchen.. 1po Once Daily 7)  Aspir-Low 81 Mg Tbec (Aspirin) .Marland Kitchen.. 1po Once Daily 8)  Indomethacin 50 Mg Caps (Indomethacin) .Marland Kitchen.. 1 By Mouth Three Times A Day As Needed Gout 9)  Vitamin D 2000 Unit Caps (Cholecalciferol) .Marland Kitchen.. 1po Once  Daily  Allergies (verified): 1)  ! Lipitor 2)  ! Zocor 3)  ! * Niaspan  Past History:  Past Medical History: Last updated: 10/07/2007 Hypertension Gout Hyperlipidemia Hypothyroidism Obesity Anxiety Benign prostatic hypertrophy Glucose intolerance Allergic rhinitis  Past Surgical History: Last updated: 03/04/2007 Denies surgical history  Social History: Last updated: 04/17/2010 Never Smoked Alcohol use-no Occupation: cust service Married 1 son  Drug use-no  Risk Factors: Smoking Status: never (04/23/2007)  Review of Systems       all otherwise negative per pt -    Physical Exam  General:  alert and overweight-appearing.   Head:  normocephalic and atraumatic.   Eyes:  vision grossly intact, pupils equal, and pupils round.   Ears:  R ear normal and L ear normal.   Nose:  no external deformity and no nasal discharge.   Mouth:  no gingival abnormalities and pharynx pink and moist.   Neck:  supple and no masses.   Lungs:  normal respiratory effort and normal breath sounds.   Heart:  normal rate and regular rhythm.   Abdomen:  soft, non-tender, and normal bowel sounds.   Extremities:  no edema, no erythema    Impression & Recommendations:  Problem # 1:  HYPOGONADISM (ICD-257.2) with normal free testosterone, hx of BPH, and neg ED symtpoms, I would not pursue testosterone replacement at this time;  I also mentioned the concern over risk of future prostate cancer which is unknown  Problem # 2:  VITAMIN D DEFICIENCY (ICD-268.9) as there is no evidence that high dose short course vit d is helpful to reduce risk of hip fx, I would forgo this for a long term vit d 2000 units per day  Problem # 3:  GLUCOSE INTOLERANCE (ICD-271.3) recent glc OK per pt labs, Pt to cont DM diet, excercise, wt control efforts; to check labs next visit  Problem # 4:  HYPERTENSION (ICD-401.9)  His updated medication list for this problem includes:    Lisinopril 40 Mg Tabs  (Lisinopril) .Marland Kitchen... 1 by mouth once daily    Furosemide 20 Mg Tabs (Furosemide) .Marland Kitchen... 1 by mouth once daily  BP today: 102/64 Prior BP: 126/70 (04/26/2010)  Prior 10 Yr Risk Heart Disease: 11 % (05/11/2009)  Labs Reviewed: K+: 5.1 (04/10/2010) Creat: : 1.3 (04/10/2010)   Chol: 129 (04/10/2010)   HDL: 34.70 (04/10/2010)   LDL: 67 (04/10/2010)   TG: 135.0 (04/10/2010) stable overall by hx and exam, ok to continue meds/tx as is   Complete Medication List: 1)  Tamsulosin Hcl 0.4 Mg Caps (Tamsulosin hcl) .Marland Kitchen.. 1po once daily 2)  Levothyroxine Sodium 100 Mcg Tabs (Levothyroxine sodium) .Marland Kitchen.. 1po once daily 3)  Crestor 10 Mg Tabs (Rosuvastatin calcium) .Marland Kitchen.. 1po once daily 4)  Lisinopril 40 Mg Tabs (Lisinopril) .Marland Kitchen.. 1 by mouth once daily 5)  Furosemide 20 Mg Tabs (Furosemide) .Marland Kitchen.. 1 by mouth once daily 6)  Klor-con 10 10 Meq Cr-tabs (Potassium chloride) .Marland Kitchen.. 1po once daily 7)  Aspir-low 81 Mg Tbec (Aspirin) .Marland Kitchen.. 1po once daily 8)  Indomethacin 50 Mg Caps (Indomethacin) .Marland Kitchen.. 1 by mouth three times a day as needed gout 9)  Vitamin D 2000 Unit Caps (Cholecalciferol) .Marland Kitchen.. 1po once daily  Patient Instructions: 1)  please take Vit D 2000 units per day 2)  Continue all previous medications as before this visit  3)  I would not recommend the Testosterone treatment at this time 4)  You are given the refills today 5)  Please schedule a follow-up appointment in Nov 2012 for CPX with labs Prescriptions: INDOMETHACIN 50 MG CAPS (INDOMETHACIN) 1 by mouth three times a day as needed gout  #90 x 3   Entered and Authorized by:   Corwin Levins MD   Signed by:   Corwin Levins MD on 06/20/2010   Method used:   Print then Give to Patient   RxID:   [id number] KLOR-CON 10 10 MEQ CR-TABS (POTASSIUM CHLORIDE) 1po once daily  #90 x 3   Entered and Authorized by:   Corwin Levins MD   Signed by:   Corwin Levins MD on 06/20/2010   Method used:   Print then Give to Patient   RxID:   [id number] FUROSEMIDE 20  MG TABS (FUROSEMIDE) 1 by mouth once daily  #90 x 3   Entered and Authorized by:   Corwin Levins MD   Signed by:   Corwin Levins MD on 06/20/2010   Method used:   Print then Give to Patient   RxID:   [id number] LISINOPRIL 40 MG TABS (LISINOPRIL) 1 by mouth once daily  #90 x 3   Entered and Authorized by:   Corwin Levins MD   Signed by:   Corwin Levins MD on 06/20/2010   Method used:   Print then Give to Patient   RxID:   [id number] CRESTOR 10 MG  TABS (ROSUVASTATIN CALCIUM) 1po once daily  #90 x 3   Entered and Authorized by:   Corwin Levins MD   Signed by:   Corwin Levins MD on 06/20/2010   Method used:   Print then Give to Patient   RxID:   [id number] LEVOTHYROXINE SODIUM 100 MCG  TABS (LEVOTHYROXINE SODIUM) 1po once daily  #90 x 3   Entered and Authorized by:   Corwin Levins MD   Signed by:   Corwin Levins MD on 06/20/2010   Method used:   Print then Give to Patient   RxID:   [id number] TAMSULOSIN HCL 0.4 MG CAPS (TAMSULOSIN HCL) 1po once daily  #90 x 3   Entered and Authorized by:   Corwin Levins MD   Signed by:   Corwin Levins MD on 06/20/2010   Method used:   Print then Give to Patient   RxID:   [id number]    Orders Added: 1)  Est. Patient Level IV [70623]

## 2010-10-23 NOTE — Assessment & Plan Note (Signed)
[facility]                               LIPID CLINIC NOTE   NAME:Luis Myers, Luis Myers                        MRN:          [id number]  DATE:09/24/2007                            DOB:          [birth year]    Return Office Visit For Lipid Clinic   PAST MEDICAL HISTORY:  1. Hyperlipidemia.  2. Hypothyroidism.  3. BPH.  4. Hypertension.   MEDICATIONS:  1. Crestor 10 mg daily.  2. Aspirin 325 mg daily.  3. Flomax 0.4 mg daily.  4. Synthroid 100 mg daily.  5. Lisinopril 10 mg daily.   PHYSICAL EXAMINATION:  VITAL SIGNS:  Weight 258 pounds, blood pressure  122/98, heart rate 60.   LABORATORY DATA:  Total cholesterol 136, HDL 33.5, triglyceride of 409,  LDL 65, fasting glucose 109, BUN 8, creatinine 1.1.  LFTs within normal  limits.  Hemoglobin A1c 5.8.   ASSESSMENT:  Luis Myers is a very pleasant gentleman who returns to  lipid clinic, today, with no chest pain, no shortness of breath, no  muscle aches or pains.  He is compliant with current medication regimen.  His total cholesterol is at goal of less than 200, triglycerides are  slightly greater than goal of less than 150.  His HDL is less than goal  of greater than 40, and LDL at goal of less than 70.  He does not follow  a very heart healthy diet.  He says he eats lots of fried foods, and  lots of snacks.  He says that he will implement a low-fat, low-  carbohydrate diet, follow it for approximately 1 week; and then succumb  to snacks being at work or in the evening time, after dinner, then feel  like going out for a fast-food; and then his healthy diet is not  followed thereafter.  We had a lengthy conversation regarding the  benefit of low-fat, low-cholesterol, low-carbohydrate diet and  increasing his fruits and vegetables and fiber.  He says that he will  consider making lifestyle changes, but it does not seem that he is going  to be very compliant with following a low-fat diet.  He seems to  be very  unwilling giving up his fried foods.  We also had a lengthy conversation  regarding the benefit of exercise.  He says in his day-to-day activities  he is very tired after work.  He does other activities in the evening  especially being on the computer for several hours after his evening  meal.  I have encouraged him twice a week to begin an exercise regimen  walking or biking for 30 minutes with the hopes of increasing from  there.  I think that this will be a difficult challenge for this  patient, and I will continue to encourage lifestyle modification for  him.  As stated above, his lipid panel is close to goal; LDL being at  goal, triglycerides being slightly elevated which, I think, would be  advantageous to adjust diet and exercise instead of changing  medications.   PLAN:  1. Continue current therapy.  2. Continue to encourage exercise and diet change.  3. Follow up visit in 4 months for lipid panel and LFTs, and make      adjustments at that time.      Leota Sauers, PharmD  Electronically Signed      Jesse Sans. Daleen Squibb, MD, [facility]  Electronically Signed   LC/MedQ  DD: 09/24/2007  DT: 09/24/2007  Job #: [id number]

## 2010-10-23 NOTE — Assessment & Plan Note (Signed)
[facility]                               LIPID CLINIC NOTE   NAME:Luis Myers, Luis Myers                        MRN:          [id number]  DATE:02/11/2008                            DOB:          1945/07/02    Return office visit for Lipid Clinic.   PAST MEDICAL HISTORY:  Hyperlipidemia, hypothyroidism, BPH, and  hypertension.   MEDICATIONS:  1. Crestor 10 mg daily.  2. Aspirin 325 mg daily.  3. Flomax 0.4 mg daily.  4. Synthroid 100 mcg daily.  5. Lisinopril 10 mg daily.   VITAL SIGNS:  Weight 235 pounds, blood pressure 130/80, and heart rate  80.   LABORATORY DATA:  Total cholesterol 126, triglycerides 121, HDL 34, LDL  68.  LFTs within normal limits.   ASSESSMENT:  Luis Myers is a very pleasant 65 year old gentleman who  returns to the Lipid Clinic today with no chest pain, no shortness of  breath, no muscle aches or pains.  He does have every excuse in the book  of why he does not exercise on a regular basis, be it from working a  lot, extracurricular activities, and picking up the grandchildren from  school and caring for them.  He also states that he just gets in a rut  and gets tired and does not feel like exercising.  He also seems to have  occasionally increase the amounts of portioned fats and carbohydrates in  his meals.  Of note is that his total cholesterol remains less than goal  of less than 200, however, increased since last visit; triglycerides  less than goal of less than 150, however, increased since last visit;  LDL less than goal of less than 70, however, increased from last visit;  and HDL less than goal of greater than 40, and decreased since last  visit.  He does understand the benefits of lifestyle modification.  We  had a lengthy conversation regarding decreasing the amount of chips and  ice cream that he would be eating as evening snacks, replacing that with  almonds, walnuts, or fruits and vegetables.  He does seem to be  willing  to make small changes in his lifestyle.  He does understand the  importance of exercise, and he states that he will try to be more  conscientious of incorporating this into his daily activities.  He  states that he does know that it makes him feel better when he does  exercise, it is just sometimes challenging once you get off your routine  to re-incorporate exercise.  I will continue to encourage his lifestyle  modification given that he does not need any medication changes at this  time.   PLAN:  1. Continue current medications.  2. Make lifestyle modification of decreasing fats and increasing      exercise.  3. Followup visit in 6 months for lipid panel and LFTs, and we will      make changes at that time.      Leota Sauers, PharmD  Electronically Signed      Jesse Sans. Wall, MD, [facility]  Electronically Signed   LC/MedQ  DD: 02/11/2008  DT: 02/12/2008  Job #: [id number]

## 2010-10-23 NOTE — Assessment & Plan Note (Signed)
[facility]                               LIPID CLINIC NOTE   NAME:Luis Myers, Luis Myers                        MRN:          [id number]  DATE:04/02/2007                            DOB:          07-Oct-1945    Mr. Eisemann comes in today for followup of his hyperlipidemia therapy,  which includes Crestor 10 mg daily.  He has been compliant with this and  tolerating it just fine.  No muscle aches, pains, or any problems like  that.   PHYSICAL EXAM:  Weight 237 pounds, blood pressure 140/90, heart rate is  80.   LABORATORY DATA:  Includes total cholesterol 129, triglycerides 152, HDL  35.1, LDL 64.  Liver function tests within normal limits.   ASSESSMENT:  Mr. Sagan blood pressure is a little bit high this  morning.  I was going to check it before he left the office, but forgot.  I asked him to monitor that at home if he can.  He is not currently on  any medications for blood pressure control.  His blood pressure last  time we saw him was 142/80 in April.  Mr. Drudge triglycerides are  close to goal at less than 150, HDL is slightly lower than last time and  is not at goal of greater than 40.  His LDL is quite low and meets the  goal of less than 100.  He has started a new job recently, and now has a  much longer commute.  He admits to not following much of a special diet  or exercise plan.  I encouraged him to do this, as we have done in the  past.  We talked about exercise and lifestyle modifications in general  being a god way to raise HDL, which is the main problem with his lipid  panel.   PLAN:  We are not going to change his medication, just stay with Crestor  10 mg daily.  I have really encouraged him to make some diet and  exercise changes.  We are going to follow up in 6 months and reevaluate  his lipid panel at that time.  I gave him samples of Crestor 10 mg.  He  was asked to call us with any problems or concerns in the  interim.      Charolotte Eke, PharmD  Electronically Signed      Madolyn Frieze. Jens Som, MD, [facility]  Electronically Signed   TP/MedQ  DD: 04/02/2007  DT: 04/02/2007  Job #: [id number]

## 2010-10-23 NOTE — Assessment & Plan Note (Signed)
[facility]                               LIPID CLINIC NOTE   NAME:WILLIAMSCrue, Otero                        MRN:          [id number]  DATE:06/30/2008                            DOB:          20-Dec-1945    Return office visit for Lipid Clinic.   PAST MEDICAL HISTORY:  1. Hyperlipidemia.  2. Hypothyroidism.  3. Benign prostate hyperplasia.  4. Hypertension.   CURRENT MEDICATIONS:  1. Crestor 10 mg daily.  2. Aspirin 81 mg daily.  3. Flomax 0.4 mg daily.  4. Synthroid 100 mcg daily.  5. Lisinopril 10 mg daily.   PHYSICAL EXAMINATION:  Weight 243 pounds, blood pressure today 140/85,  and heart rate 80.   LABORATORY DATA:  Total cholesterol 125, triglyceride 128, HDL 33, LDL  66.  LFTs within normal limits.   ASSESSMENT:  Mr. Silveria is a very pleasant 65 year old gentleman who  returns to Lipid Clinic today with no chest pain, no shortness of  breath, no muscle aches or pains.  He states compliance with current  medication regimen.  He however is very noncompliant with exercise  regimen, which he for a year now has been telling me that he will re-  initiate; however, he always has some excuse of what he has not.  Most  recently his excuses are that he just has not found the time that he  leaves work about 3:30, it takes 145 minutes to go home.  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  We had a lengthy conversation.  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  I  feel that once this gentleman begins routine exercise routine that we  can add on increased hours, increased time, and increased days, but the  most important for him is his  lifestyle change and beginning a routine  of exercise.  He also is very noncompliant with a low-fat and low-  carbohydrate diet.  He has a Engineer, civil (consulting) for breakfast on most days, but  occasionally eats some cereal.  We have offered healthy alternatives  both to that and his lunch, which is most often a sandwich.  We have  given him options to take with his sandwich as well as changing his  bread and offered small changes with his evening meal, especially  decreasing the portion sizes that he in the past has admitted have  increased in the last year.  He says that he tries not to snack or eat  after 7 o'clock which he does fairly well though he states.  His total  cholesterol is at goal of less than 200, triglycerides at goal of less  than 150, HDL less than goal of greater than 40, LDL  at goal of less  than 70, non HDL is at goal of 100.   PLAN:  1. Continue to encourage exercise regimen.  2. Continue to encourage lifestyle modification, especially diet,      decreasing fats and carbs in his diet.  3. Continue current medications with the possibility of adding fish      oil at next visit.  He has in the past been intolerant to NIASPAN.  4. Followup visit in 4 months for lipid panel and LFTs and we will      make adjustments at that time.      Leota Sauers, PharmD  Electronically Signed      Jesse Sans. Daleen Squibb, MD, [facility]  Electronically Signed   LC/MedQ  DD: 06/30/2008  DT: 07/01/2008  Job #: [id number]

## 2010-10-26 NOTE — Assessment & Plan Note (Signed)
[facility]                              CARDIOLOGY OFFICE NOTE   NAME:Sherk, BAZIL DHANANI                      MRN:          [id number]  DATE:04/03/2006                            DOB:          03/08/1946    Mr. Topper comes in today for followup of his cholesterol, hyperlipidemia  therapy.  His current cholesterol medicine is Crestor 10 mg daily.  Other  medications have not changed, they are Synthroid, adult aspirin and Flomax.  He has been compliant with his Crestor, and has been tolerating it just fine  with no muscle problems.   PHYSICAL EXAMINATION:  Weight is 242 pounds, this is up by 8 pounds from May  of this year.  Blood pressure is 140/86, heart rate is 68.   LABORATORY DATA:  Includes total cholesterol 132, triglycerides 110, HDL 35,  LDL 75.  Liver function tests are within normal limits.   ASSESSMENT:  Mr. Handshoe' triglycerides are at goal, HDL is about the same,  not quite at goal of greater than 40.  LDL has risen slightly, and is no  longer at the goal of less than 70.  He reports that his diet has been about  the same as usual.  He has greatly reduced fast food and eats fewer eggs.  Breakfast includes some dry cereal with 2% milk, usually Cheerios.  Occasionally he will have a sweet item for breakfast, like a Pop Tart.  Lunch, he will go out to a [employer] or a restaurant with  coworkers, or he will go home and eat just a sandwich.  Snacking is a  weakness of his, he will have a pack of Nabs peanut butter crackers,  something like that for snacks occasionally throughout the day.  He admits  to not exercising much lately, partly because of the hot weather.  He does  try to walk.  When he does exercise it is walking around the neighborhood  every day.   PLAN:  We are going to continue the same medications, and samples were  given, and I will see him back in 6 months for a lipid and liver panel, and  I encouraged him  to increase the amount of exercise he is getting with  walking at least 30 minutes 4 or 5 days a week, and to cut down on those  snacks throughout the day.  I encouraged him to eat more fruits and  vegetables, taking to work grapes, apple, banana for a snack, also trying to  incorporate fruit with his breakfast and limiting Pop Tarts in the morning.  He seems to be receptive to this, and we will follow up with him in 6 months  and make adjustments as necessary.     ______________________________  Charolotte Eke, PharmD    ______________________________  Luis Abed, MD, [facility]   TP/MedQ  DD: 04/03/2006  DT: 04/04/2006  Job #: [id number]

## 2010-10-26 NOTE — Assessment & Plan Note (Signed)
[facility]                               LIPID CLINIC NOTE   NAME:Luis Myers, Luis Myers                      MRN:          [id number]  DATE:10/02/2006                            DOB:          [birth year]    [name] comes in today for followup of his hyperlipidemia therapy.  He continues to take Crestor 10 mg daily.  He has been compliant with  this and has been tolerating it just fine.  His other medications  include Synthroid, enteric coated aspirin, Flomax and Indocin p.r.n.   PHYSICAL EXAMINATION:  VITAL SIGNS:  Weight 238 pounds, blood pressure  142/80.   LABORATORY DATA AND X-RAY FINDINGS:  Total cholesterol 135,  triglycerides 164, HDL 37.1, LDL 65.  Liver function tests are within  normal limits.   ASSESSMENT:  Luis Myers triglycerides are above the goal of less than  150 and he admits to eating more carbohydrates and sugars in his diet.  His HDL is not at goal, but it has increased slightly.  His LDL is  currently at a goal of less than 100.   PLAN:  Continue Crestor 10 mg daily.  Decrease the amount of sugar and  carbohydrates in his diet.  Decrease he is eating out.  He has not been  exercising much lately and we encouraged him to start an exercise  routine working up to a goal of exercising at least three times a week  or every other day.  We have scheduled followup with him in 6 months for  liver and lipid panels.   This patient was seen by Jeananne Rama, [employer]-PharmD candidate.      Charolotte Eke, PharmD  Electronically Signed      Rollene Rotunda, MD, [facility]  Electronically Signed   TP/MedQ  DD: 10/02/2006  DT: 10/02/2006  Job #: [id number]

## 2011-01-01 ENCOUNTER — Ambulatory Visit (INDEPENDENT_AMBULATORY_CARE_PROVIDER_SITE_OTHER): Payer: BC Managed Care – PPO | Admitting: Internal Medicine

## 2011-01-01 ENCOUNTER — Encounter: Payer: Self-pay | Admitting: Internal Medicine

## 2011-01-01 VITALS — BP 120/64 | HR 67 | Temp 99.0°F | Ht 70.0 in | Wt 238.5 lb

## 2011-01-01 DIAGNOSIS — J029 Acute pharyngitis, unspecified: Secondary | ICD-10-CM

## 2011-01-01 DIAGNOSIS — I10 Essential (primary) hypertension: Secondary | ICD-10-CM

## 2011-01-01 DIAGNOSIS — R7302 Impaired glucose tolerance (oral): Secondary | ICD-10-CM

## 2011-01-01 DIAGNOSIS — Z0001 Encounter for general adult medical examination with abnormal findings: Secondary | ICD-10-CM | POA: Insufficient documentation

## 2011-01-01 DIAGNOSIS — Z0000 Encounter for general adult medical examination without abnormal findings: Secondary | ICD-10-CM

## 2011-01-01 DIAGNOSIS — R7309 Other abnormal glucose: Secondary | ICD-10-CM

## 2011-01-01 HISTORY — DX: Impaired glucose tolerance (oral): R73.02

## 2011-01-01 MED ORDER — AZITHROMYCIN 250 MG PO TABS: ORAL_TABLET | ORAL | Status: AC

## 2011-01-01 NOTE — Assessment & Plan Note (Signed)
stable overall by hx and exam, most recent data reviewed with pt, and pt to continue medical treatment as before  BP Readings from Last 3 Encounters:  01/01/11 120/64  06/20/10 102/64  04/26/10 126/70

## 2011-01-01 NOTE — Assessment & Plan Note (Signed)
Mild to mod, for antibx course,  to f/u any worsening symptoms or concerns 

## 2011-01-01 NOTE — Patient Instructions (Addendum)
Take all new medications as prescribed Continue all other medications as before Please return in 3 mo with Lab testing done 3-5 days before  

## 2011-01-01 NOTE — Assessment & Plan Note (Signed)
stable overall by hx and exam, most recent data reviewed with pt, and pt to continue medical treatment as before  Lab Results  Component Value Date   HGBA1C 6.1 02/20/2010   Pt to call with onset polys or cbg > 200

## 2011-01-01 NOTE — Progress Notes (Signed)
  Subjective:    Patient ID: Luis Myers, male    DOB: 1946-02-12, 65 y.o.   MRN: [id number]  HPI   Here with 3 days acute onset fever, general weakness and malaise, wioth severe ST, but little to no cough and Pt denies chest pain, increased sob or doe, wheezing, orthopnea, PND, increased LE swelling, palpitations, dizziness or syncope. Pt denies new neurological symptoms such as new headache, or facial or extremity weakness or numbness   Pt denies polydipsia, polyuria, or low sugar symptoms such as weakness or confusion improved with po intake.  Pt states overall good compliance with meds, trying to follow lower cholesterol, diabetic diet, wt overall stable but little exercise however.   Overall good compliance with treatment, and good medicine tolerability.   Past Medical History  Diagnosis Date  . Acute gouty arthropathy 11/04/2008  . ALLERGIC RHINITIS 10/07/2007  . ANXIETY 03/04/2007  . BENIGN PROSTATIC HYPERTROPHY 03/04/2007  . CONJUNCTIVITIS, ACUTE, LEFT 11/27/2007  . GLUCOSE INTOLERANCE 04/23/2007  . GOUT 03/04/2007  . HYPERLIPIDEMIA 03/04/2007  . HYPERSOMNIA 02/20/2010  . HYPERTENSION 03/04/2007  . HYPOGONADISM 06/20/2010  . HYPOTHYROIDISM 03/04/2007  . Impaired fasting glucose 04/16/2007  . Morbid obesity 03/04/2007  . PERIPHERAL EDEMA 11/04/2008  . Pure hypercholesterolemia 10/17/2008  . RASH-NONVESICULAR 07/25/2009  . SINUSITIS- ACUTE-NOS 05/16/2008  . URI 11/27/2007  . VITAMIN D DEFICIENCY 06/20/2010  . Impaired glucose tolerance 01/01/2011   No past surgical history on file.  reports that he has never smoked. He does not have any smokeless tobacco history on file. He reports that he does not drink alcohol or use illicit drugs. family history includes Cancer in his father; Diabetes in his other; and Heart disease in his brother and father. Allergies  Allergen Reactions  . Atorvastatin   . Niacin   . Simvastatin    No current outpatient prescriptions on file prior to visit.   Review of  Systems Review of Systems  Constitutional: Negative for diaphoresis and unexpected weight change.  HENT: Negative for drooling and tinnitus.   Eyes: Negative for photophobia and visual disturbance.  Respiratory: Negative for choking and stridor.         Objective:   Physical Exam BP 120/64  Pulse 67  Temp(Src) 99 F (37.2 C) (Oral)  Ht 5\' 10"  (1.778 m)  Wt 238 lb 8 oz (108.183 kg)  BMI 34.22 kg/m2  SpO2 95% Physical Exam  VS noted, mild ill Constitutional: Pt appears well-developed and well-nourished.  HENT: Head: Normocephalic.  Right Ear: External ear normal.  Left Ear: External ear normal.  Bilat tm's mild erythema.  Sinus nontender.  Pharynx severe erythema with mild exudate Eyes: Conjunctivae and EOM are normal. Pupils are equal, round, and reactive to light.  Neck: Normal range of motion. Neck supple.  Cardiovascular: Normal rate and regular rhythm.   Pulmonary/Chest: Effort normal and breath sounds normal.  Neurological: Pt is alert. No cranial nerve deficit.  Skin: Skin is warm. No erythema.  Psychiatric: Pt behavior is normal. Thought content normal.         Assessment & Plan:

## 2011-03-26 ENCOUNTER — Telehealth: Payer: Self-pay | Admitting: *Deleted

## 2011-03-26 DIAGNOSIS — Z0000 Encounter for general adult medical examination without abnormal findings: Secondary | ICD-10-CM

## 2011-03-26 DIAGNOSIS — Z125 Encounter for screening for malignant neoplasm of prostate: Secondary | ICD-10-CM

## 2011-03-26 NOTE — Telephone Encounter (Signed)
Received staff msg pt schedule cpx. Need labs in EPIC...03/26/11@3 :44pm/LMB

## 2011-04-15 ENCOUNTER — Other Ambulatory Visit (INDEPENDENT_AMBULATORY_CARE_PROVIDER_SITE_OTHER): Payer: BC Managed Care – PPO

## 2011-04-15 DIAGNOSIS — Z125 Encounter for screening for malignant neoplasm of prostate: Secondary | ICD-10-CM

## 2011-04-15 DIAGNOSIS — Z0000 Encounter for general adult medical examination without abnormal findings: Secondary | ICD-10-CM

## 2011-04-15 LAB — LIPID PANEL
HDL: 41.3 mg/dL (ref >39.00)
LDL Cholesterol: 69 mg/dL (ref 0–99)
VLDL: 29 mg/dL (ref 0.0–40.0)

## 2011-04-15 LAB — URINALYSIS, ROUTINE W REFLEX MICROSCOPIC
Bilirubin Urine: NEGATIVE
Hgb urine dipstick: NEGATIVE
Ketones, ur: NEGATIVE
Leukocytes, UA: NEGATIVE
pH: 6 (ref 5.0–8.0)

## 2011-04-15 LAB — HEPATIC FUNCTION PANEL
ALT: 19 U/L (ref 0–53)
AST: 16 U/L (ref 0–37)
Alkaline Phosphatase: 58 U/L (ref 39–117)
Bilirubin, Direct: 0.1 mg/dL (ref 0.0–0.3)
Total Bilirubin: 1 mg/dL (ref 0.3–1.2)

## 2011-04-15 LAB — TSH: TSH: 3.02 u[IU]/mL (ref 0.35–5.50)

## 2011-04-15 LAB — BASIC METABOLIC PANEL
BUN: 12 mg/dL (ref 6–23)
Creatinine, Ser: 1.3 mg/dL (ref 0.4–1.5)
GFR: 60.83 mL/min (ref >60.00)
Potassium: 4.8 mEq/L (ref 3.5–5.1)

## 2011-04-15 LAB — CBC WITH DIFFERENTIAL/PLATELET
Eosinophils Relative: 1.8 % (ref 0.0–5.0)
HCT: 43.1 % (ref 39.0–52.0)
Lymphocytes Relative: 32 % (ref 12.0–46.0)
Monocytes Relative: 7.8 % (ref 3.0–12.0)
Neutrophils Relative %: 58 % (ref 43.0–77.0)
Platelets: 145 10*3/uL — ABNORMAL LOW (ref 150.0–400.0)
WBC: 6 10*3/uL (ref 4.5–10.5)

## 2011-04-23 ENCOUNTER — Ambulatory Visit (INDEPENDENT_AMBULATORY_CARE_PROVIDER_SITE_OTHER): Payer: BC Managed Care – PPO | Admitting: Internal Medicine

## 2011-04-23 ENCOUNTER — Encounter: Payer: Self-pay | Admitting: Internal Medicine

## 2011-04-23 VITALS — BP 132/70 | HR 64 | Temp 98.2°F | Ht 69.0 in | Wt 241.5 lb

## 2011-04-23 DIAGNOSIS — Z0000 Encounter for general adult medical examination without abnormal findings: Secondary | ICD-10-CM

## 2011-04-23 MED ORDER — FUROSEMIDE 20 MG PO TABS: 20.0000 mg | ORAL_TABLET | Freq: Every day | ORAL | Status: DC | PRN

## 2011-04-23 MED ORDER — LISINOPRIL 40 MG PO TABS: 40.0000 mg | ORAL_TABLET | Freq: Every day | ORAL | Status: DC

## 2011-04-23 MED ORDER — LEVOTHYROXINE SODIUM 100 MCG PO TABS: 100.0000 ug | ORAL_TABLET | Freq: Every day | ORAL | Status: DC

## 2011-04-23 MED ORDER — ROSUVASTATIN CALCIUM 10 MG PO TABS: 10.0000 mg | ORAL_TABLET | Freq: Every day | ORAL | Status: DC

## 2011-04-23 MED ORDER — TAMSULOSIN HCL 0.4 MG PO CAPS: 0.4000 mg | ORAL_CAPSULE | Freq: Every day | ORAL | Status: DC

## 2011-04-23 MED ORDER — POTASSIUM CHLORIDE 10 MEQ PO TBCR: 10.0000 meq | EXTENDED_RELEASE_TABLET | Freq: Every day | ORAL | Status: DC | PRN

## 2011-04-23 NOTE — Progress Notes (Signed)
Subjective:    Patient ID: Luis Myers, male    DOB: 1945/10/17, 65 y.o.   MRN: [id number]  HPI  Here for wellness and f/u;  Overall doing ok;  Pt denies CP, worsening SOB, DOE, wheezing, orthopnea, PND, worsening LE edema, palpitations, dizziness or syncope.  Pt denies neurological change such as new Headache, facial or extremity weakness.  Pt denies polydipsia, polyuria, or low sugar symptoms. Pt states overall good compliance with treatment and medications, good tolerability, and trying to follow lower cholesterol diet.  Pt denies worsening depressive symptoms, suicidal ideation or panic. No fever, wt loss, night sweats, loss of appetite, or other constitutional symptoms.  Pt states good ability with ADL's, low fall risk, home safety reviewed and adequate, no significant changes in hearing or vision, and occasionally active with exercise.  Has been trying to lose 1 lb per month to keep the lower rate on his health insurance but so far not able. Past Medical History  Diagnosis Date  . Acute gouty arthropathy 11/04/2008  . ALLERGIC RHINITIS 10/07/2007  . ANXIETY 03/04/2007  . BENIGN PROSTATIC HYPERTROPHY 03/04/2007  . CONJUNCTIVITIS, ACUTE, LEFT 11/27/2007  . GLUCOSE INTOLERANCE 04/23/2007  . GOUT 03/04/2007  . HYPERLIPIDEMIA 03/04/2007  . HYPERSOMNIA 02/20/2010  . HYPERTENSION 03/04/2007  . HYPOGONADISM 06/20/2010  . HYPOTHYROIDISM 03/04/2007  . Impaired fasting glucose 04/16/2007  . Morbid obesity 03/04/2007  . PERIPHERAL EDEMA 11/04/2008  . Pure hypercholesterolemia 10/17/2008  . RASH-NONVESICULAR 07/25/2009  . SINUSITIS- ACUTE-NOS 05/16/2008  . URI 11/27/2007  . VITAMIN D DEFICIENCY 06/20/2010  . Impaired glucose tolerance 01/01/2011   No past surgical history on file.  reports that he has never smoked. He does not have any smokeless tobacco history on file. He reports that he does not drink alcohol or use illicit drugs. family history includes Cancer in his father; Diabetes in his other; and Heart  disease in his brother and father. Allergies  Allergen Reactions  . Atorvastatin   . Niacin   . Simvastatin    Current Outpatient Prescriptions on File Prior to Visit  Medication Sig Dispense Refill  . Cholecalciferol (VITAMIN D) 2000 UNITS CAPS Take by mouth daily.        . furosemide (LASIX) 20 MG tablet Take 20 mg by mouth daily.        . indomethacin (INDOCIN) 50 MG capsule Take 50 mg by mouth 3 (three) times daily.        Marland Kitchen levothyroxine (SYNTHROID, LEVOTHROID) 100 MCG tablet Take 100 mcg by mouth daily.        Marland Kitchen lisinopril (PRINIVIL,ZESTRIL) 40 MG tablet Take 40 mg by mouth daily.        . potassium chloride (KLOR-CON) 10 MEQ CR tablet Take 10 mEq by mouth daily.        . rosuvastatin (CRESTOR) 10 MG tablet Take 10 mg by mouth daily.        . Tamsulosin HCl (FLOMAX) 0.4 MG CAPS Take by mouth daily.        Marland Kitchen aspirin 81 MG tablet Take 81 mg by mouth daily.         Review of Systems Review of Systems  Constitutional: Negative for diaphoresis, activity change, appetite change and unexpected weight change.  HENT: Negative for hearing loss, ear pain, facial swelling, mouth sores and neck stiffness.   Eyes: Negative for pain, redness and visual disturbance.  Respiratory: Negative for shortness of breath and wheezing.   Cardiovascular: Negative for chest pain and palpitations.  Gastrointestinal: Negative  for diarrhea, blood in stool, abdominal distention and rectal pain.  Genitourinary: Negative for hematuria, flank pain and decreased urine volume.  Musculoskeletal: Negative for myalgias and joint swelling.  Skin: Negative for color change and wound.  Neurological: Negative for syncope and numbness.  Hematological: Negative for adenopathy.  Psychiatric/Behavioral: Negative for hallucinations, self-injury, decreased concentration and agitation.  Has had some hemorrhoidal activity with burning,ithcing in the past few months.  No blood.    Objective:   Physical Exam BP 132/70  Pulse  64  Temp(Src) 98.2 F (36.8 C) (Oral)  Ht 5\' 9"  (1.753 m)  Wt 241 lb 8 oz (109.544 kg)  BMI 35.66 kg/m2  SpO2 97% Physical Exam  VS noted Constitutional: Pt is oriented to person, place, and time. Appears well-developed and well-nourished.  HENT:  Head: Normocephalic and atraumatic.  Right Ear: External ear normal.  Left Ear: External ear normal.  Nose: Nose normal.  Mouth/Throat: Oropharynx is clear and moist.  Eyes: Conjunctivae and EOM are normal. Pupils are equal, round, and reactive to light.  Neck: Normal range of motion. Neck supple. No JVD present. No tracheal deviation present.  Cardiovascular: Normal rate, regular rhythm, normal heart sounds and intact distal pulses.   Pulmonary/Chest: Effort normal and breath sounds normal.  Abdominal: Soft. Bowel sounds are normal. There is no tenderness.  Musculoskeletal: Normal range of motion. Exhibits no edema.  Lymphadenopathy:  Has no cervical adenopathy.  Neurological: Pt is alert and oriented to person, place, and time. Pt has normal reflexes. No cranial nerve deficit.  Skin: Skin is warm and dry. No rash noted.  Psychiatric:  Has  normal mood and affect. Behavior is normal.         Assessment & Plan:

## 2011-04-23 NOTE — Assessment & Plan Note (Signed)

## 2011-04-23 NOTE — Patient Instructions (Signed)
Please remember to get regular exercise, low cholesterol diet and weight control Continue all other medications as before Your refills were sent as requested You are otherwise up to date with prevention today Please return in 1 year for your yearly visit, or sooner if needed, with Lab testing done 3-5 days before

## 2011-06-05 ENCOUNTER — Ambulatory Visit (INDEPENDENT_AMBULATORY_CARE_PROVIDER_SITE_OTHER): Payer: BC Managed Care – PPO | Admitting: Internal Medicine

## 2011-06-05 ENCOUNTER — Encounter: Payer: Self-pay | Admitting: Internal Medicine

## 2011-06-05 VITALS — BP 110/60 | HR 76 | Temp 99.3°F | Ht 70.0 in | Wt 236.0 lb

## 2011-06-05 DIAGNOSIS — R062 Wheezing: Secondary | ICD-10-CM

## 2011-06-05 DIAGNOSIS — R7309 Other abnormal glucose: Secondary | ICD-10-CM

## 2011-06-05 DIAGNOSIS — R7302 Impaired glucose tolerance (oral): Secondary | ICD-10-CM

## 2011-06-05 DIAGNOSIS — J209 Acute bronchitis, unspecified: Secondary | ICD-10-CM

## 2011-06-05 DIAGNOSIS — I10 Essential (primary) hypertension: Secondary | ICD-10-CM

## 2011-06-05 MED ORDER — PREDNISONE 10 MG PO TABS: 10.0000 mg | ORAL_TABLET | Freq: Every day | ORAL | Status: DC

## 2011-06-05 MED ORDER — AZITHROMYCIN 250 MG PO TABS: ORAL_TABLET | ORAL | Status: AC

## 2011-06-05 NOTE — Assessment & Plan Note (Signed)
Mild to mod, for predpack  course,  to f/u any worsening symptoms or concerns 

## 2011-06-05 NOTE — Assessment & Plan Note (Signed)
Mild to mod, for antibx course,  to f/u any worsening symptoms or concerns 

## 2011-06-05 NOTE — Progress Notes (Signed)
Subjective:    Patient ID: Luis Myers, male    DOB: Aug 26, 1945, 65 y.o.   MRN: [id number]  HPI  Here with acute onset mild to mod 2-3 days ST, HA, general weakness and malaise, with prod cough greenish sputum, but Pt denies chest pain, increased sob or doe, wheezing, orthopnea, PND, increased LE swelling, palpitations, dizziness or syncope, except for onset mild wheezing today, more with exhaling.  No hx of tobacco recent.  Pt denies new neurological symptoms such as new headache, or facial or extremity weakness or numbness   Pt denies polydipsia, polyuria,  Pt states overall good compliance with meds, trying to follow lower cholesterol diet, wt overall stable.   Pt denies fever, wt loss, night sweats, loss of appetite, or other constitutional symptoms, except for today  Past Medical History  Diagnosis Date  . Acute gouty arthropathy 11/04/2008  . ALLERGIC RHINITIS 10/07/2007  . ANXIETY 03/04/2007  . BENIGN PROSTATIC HYPERTROPHY 03/04/2007  . CONJUNCTIVITIS, ACUTE, LEFT 11/27/2007  . GLUCOSE INTOLERANCE 04/23/2007  . GOUT 03/04/2007  . HYPERLIPIDEMIA 03/04/2007  . HYPERSOMNIA 02/20/2010  . HYPERTENSION 03/04/2007  . HYPOGONADISM 06/20/2010  . HYPOTHYROIDISM 03/04/2007  . Impaired fasting glucose 04/16/2007  . Morbid obesity 03/04/2007  . PERIPHERAL EDEMA 11/04/2008  . Pure hypercholesterolemia 10/17/2008  . RASH-NONVESICULAR 07/25/2009  . SINUSITIS- ACUTE-NOS 05/16/2008  . URI 11/27/2007  . VITAMIN D DEFICIENCY 06/20/2010  . Impaired glucose tolerance 01/01/2011   No past surgical history on file.  reports that he has never smoked. He does not have any smokeless tobacco history on file. He reports that he does not drink alcohol or use illicit drugs. family history includes Cancer in his father; Diabetes in his other; and Heart disease in his brother and father. Allergies  Allergen Reactions  . Atorvastatin   . Niacin   . Simvastatin    Current Outpatient Prescriptions on File Prior to Visit    Medication Sig Dispense Refill  . aspirin 81 MG tablet Take 81 mg by mouth daily.        . Cholecalciferol (VITAMIN D) 2000 UNITS CAPS Take by mouth daily.        . furosemide (LASIX) 20 MG tablet Take 1 tablet (20 mg total) by mouth daily as needed.  90 tablet  3  . indomethacin (INDOCIN) 50 MG capsule Take 50 mg by mouth 3 (three) times daily.        Marland Kitchen levothyroxine (SYNTHROID, LEVOTHROID) 100 MCG tablet Take 1 tablet (100 mcg total) by mouth daily.  90 tablet  3  . lisinopril (PRINIVIL,ZESTRIL) 40 MG tablet Take 1 tablet (40 mg total) by mouth daily.  90 tablet  3  . potassium chloride (KLOR-CON) 10 MEQ CR tablet Take 1 tablet (10 mEq total) by mouth daily as needed.  90 tablet    . rosuvastatin (CRESTOR) 10 MG tablet Take 1 tablet (10 mg total) by mouth daily.  90 tablet  3  . Tamsulosin HCl (FLOMAX) 0.4 MG CAPS Take 1 capsule (0.4 mg total) by mouth daily.  90 capsule  3   Review of Systems Review of Systems  Constitutional: Negative for diaphoresis and unexpected weight change.  HENT: Negative for drooling and tinnitus.   Eyes: Negative for photophobia and visual disturbance.  Respiratory: Negative for choking and stridor.   Gastrointestinal: Negative for vomiting and blood in stool.    Objective:   Physical Exam BP 110/60  Pulse 76  Temp(Src) 99.3 F (37.4 C) (Oral)  Ht 5\' 10"  (  1.778 m)  Wt 236 lb (107.049 kg)  BMI 33.86 kg/m2  SpO2 95% Physical Exam  VS noted, mild ill Constitutional: Pt appears well-developed and well-nourished.  HENT: Head: Normocephalic.  Right Ear: External ear normal.  Left Ear: External ear normal.  Bilat tm's mild erythema.  Sinus tender bilat.  Pharynx mild erythema Eyes: Conjunctivae and EOM are normal. Pupils are equal, round, and reactive to light.  Neck: Normal range of motion. Neck supple.  Cardiovascular: Normal rate and regular rhythm.   Pulmonary/Chest: Effort normal and breath sounds mild decreased, with few bilat wheeze Neurological:  Pt is alert. No cranial nerve deficit.  Skin: Skin is warm. No erythema.  Psychiatric: Pt behavior is normal. Thought content normal.     Assessment & Plan:

## 2011-06-05 NOTE — Assessment & Plan Note (Signed)
stable overall by hx and exam, most recent data reviewed with pt, and pt to continue medical treatment as before  Lab Results  Component Value Date   HGBA1C 6.1 02/20/2010

## 2011-06-05 NOTE — Assessment & Plan Note (Signed)
stable overall by hx and exam, most recent data reviewed with pt, and pt to continue medical treatment as before  BP Readings from Last 3 Encounters:  06/05/11 110/60  04/23/11 132/70  01/01/11 120/64

## 2011-06-05 NOTE — Patient Instructions (Signed)
Take all new medications as prescribed Continue all other medications as before  

## 2011-07-08 ENCOUNTER — Other Ambulatory Visit: Payer: Self-pay

## 2011-07-08 MED ORDER — ROSUVASTATIN CALCIUM 10 MG PO TABS: 10.0000 mg | ORAL_TABLET | Freq: Every day | ORAL | Status: DC

## 2011-07-08 MED ORDER — LEVOTHYROXINE SODIUM 100 MCG PO TABS: 100.0000 ug | ORAL_TABLET | Freq: Every day | ORAL | Status: DC

## 2011-07-08 MED ORDER — LISINOPRIL 40 MG PO TABS: 40.0000 mg | ORAL_TABLET | Freq: Every day | ORAL | Status: DC

## 2011-07-08 MED ORDER — TAMSULOSIN HCL 0.4 MG PO CAPS: 0.4000 mg | ORAL_CAPSULE | Freq: Every day | ORAL | Status: DC

## 2011-07-31 ENCOUNTER — Ambulatory Visit (INDEPENDENT_AMBULATORY_CARE_PROVIDER_SITE_OTHER): Payer: BC Managed Care – PPO | Admitting: Internal Medicine

## 2011-07-31 ENCOUNTER — Encounter: Payer: Self-pay | Admitting: Internal Medicine

## 2011-07-31 VITALS — BP 106/66 | HR 58 | Temp 97.8°F | Ht 70.0 in | Wt 238.4 lb

## 2011-07-31 DIAGNOSIS — M109 Gout, unspecified: Secondary | ICD-10-CM | POA: Insufficient documentation

## 2011-07-31 DIAGNOSIS — I10 Essential (primary) hypertension: Secondary | ICD-10-CM

## 2011-07-31 DIAGNOSIS — R7302 Impaired glucose tolerance (oral): Secondary | ICD-10-CM

## 2011-07-31 DIAGNOSIS — R7309 Other abnormal glucose: Secondary | ICD-10-CM

## 2011-07-31 MED ORDER — COLCHICINE 0.6 MG PO TABS: 0.6000 mg | ORAL_TABLET | Freq: Two times a day (BID) | ORAL | Status: DC

## 2011-07-31 MED ORDER — PREDNISONE 10 MG PO TABS: 10.0000 mg | ORAL_TABLET | Freq: Every day | ORAL | Status: DC

## 2011-07-31 NOTE — Assessment & Plan Note (Signed)
stable overall by hx and exam, most recent data reviewed with pt, and pt to continue medical treatment as before  BP Readings from Last 3 Encounters:  07/31/11 106/66  06/05/11 110/60  04/23/11 132/70

## 2011-07-31 NOTE — Patient Instructions (Signed)
Take all new medications as prescribed - the prednisone, and the colchicine (ok to take one pill per day) Continue all other medications as before

## 2011-07-31 NOTE — Assessment & Plan Note (Addendum)
Mild to mod, recurrant, allopurinol intoleranct, for predpack asd  Add colchicine long term, to f/u any worsening symptoms or concerns

## 2011-07-31 NOTE — Progress Notes (Signed)
Subjective:    Patient ID: Luis Myers, male    DOB: Oct 08, 1945, 66 y.o.   MRN: [id number]  HPI Here with acute onset 1 wk right first MCP red/swelling/tender with swelling to the lateral hand assoc as well, without fever, trauma, red streaks, fever.  Has has of LE recurrant gouty arthritis in the past.  Has hx of rash with allopurinol in the past.  Pt denies chest pain, increased sob or doe, wheezing, orthopnea, PND, increased LE swelling, palpitations, dizziness or syncope.  Pt denies new neurological symptoms such as new headache, or facial or extremity weakness or numbness   Pt denies polydipsia, polyuria, .  Pt states overall good compliance with meds, trying to follow lower cholesterol, diabetic diet, wt overall stable.   Pt denies fever, wt loss, night sweats, loss of appetite, or other constitutional symptoms Past Medical History  Diagnosis Date  . Acute gouty arthropathy 11/04/2008  . ALLERGIC RHINITIS 10/07/2007  . ANXIETY 03/04/2007  . BENIGN PROSTATIC HYPERTROPHY 03/04/2007  . CONJUNCTIVITIS, ACUTE, LEFT 11/27/2007  . GLUCOSE INTOLERANCE 04/23/2007  . GOUT 03/04/2007  . HYPERLIPIDEMIA 03/04/2007  . HYPERSOMNIA 02/20/2010  . HYPERTENSION 03/04/2007  . HYPOGONADISM 06/20/2010  . HYPOTHYROIDISM 03/04/2007  . Impaired fasting glucose 04/16/2007  . Morbid obesity 03/04/2007  . PERIPHERAL EDEMA 11/04/2008  . Pure hypercholesterolemia 10/17/2008  . RASH-NONVESICULAR 07/25/2009  . SINUSITIS- ACUTE-NOS 05/16/2008  . URI 11/27/2007  . VITAMIN D DEFICIENCY 06/20/2010  . Impaired glucose tolerance 01/01/2011   No past surgical history on file.  reports that he has never smoked. He does not have any smokeless tobacco history on file. He reports that he does not drink alcohol or use illicit drugs. family history includes Cancer in his father; Diabetes in his other; and Heart disease in his brother and father. Allergies  Allergen Reactions  . Atorvastatin   . Niacin   . Simvastatin   . Allopurinol  Rash   Current Outpatient Prescriptions on File Prior to Visit  Medication Sig Dispense Refill  . aspirin 81 MG tablet Take 81 mg by mouth daily.        . Cholecalciferol (VITAMIN D) 2000 UNITS CAPS Take by mouth daily.        . indomethacin (INDOCIN) 50 MG capsule Take 50 mg by mouth 3 (three) times daily.        Marland Kitchen levothyroxine (SYNTHROID, LEVOTHROID) 100 MCG tablet Take 1 tablet (100 mcg total) by mouth daily.  90 tablet  3  . lisinopril (PRINIVIL,ZESTRIL) 40 MG tablet Take 1 tablet (40 mg total) by mouth daily.  90 tablet  3  . rosuvastatin (CRESTOR) 10 MG tablet Take 1 tablet (10 mg total) by mouth daily.  90 tablet  3  . Tamsulosin HCl (FLOMAX) 0.4 MG CAPS Take 1 capsule (0.4 mg total) by mouth daily.  90 capsule  3  . furosemide (LASIX) 20 MG tablet Take 1 tablet (20 mg total) by mouth daily as needed.  90 tablet  3  . potassium chloride (KLOR-CON) 10 MEQ CR tablet Take 1 tablet (10 mEq total) by mouth daily as needed.  90 tablet     Review of Systems Review of Systems  Constitutional: Negative for diaphoresis and unexpected weight change.  HENT: Negative for drooling and tinnitus.   Eyes: Negative for photophobia and visual disturbance.  Respiratory: Negative for choking and stridor.   Gastrointestinal: Negative for vomiting and blood in stool.  Genitourinary: Negative for hematuria and decreased urine volume.  Musculoskeletal: Negative for  gait problem.      Objective:   Physical Exam BP 106/66  Pulse 58  Temp(Src) 97.8 F (36.6 C) (Oral)  Ht 5\' 10"  (1.778 m)  Wt 238 lb 6 oz (108.126 kg)  BMI 34.20 kg/m2  SpO2 97% Physical Exam  VS noted, not ill appearing Constitutional: Pt appears well-developed and well-nourished.  HENT: Head: Normocephalic.  Right Ear: External ear normal.  Left Ear: External ear normal.  Eyes: Conjunctivae and EOM are normal. Pupils are equal, round, and reactive to light.  Neck: Normal range of motion. Neck supple.  Cardiovascular: Normal rate  and regular rhythm.   Pulmonary/Chest: Effort normal and breath sounds normal.  Neurological: Pt is alert. No cranial nerve deficit.  Skin: Skin is warm. No erythema.  Right first MCP with 1-2+ red, tender, swelling - hand o/w neurovasc intact Psychiatric: Pt behavior is normal. Thought content normal.     Assessment & Plan:

## 2011-07-31 NOTE — Assessment & Plan Note (Signed)
stable overall by hx and exam, most recent data reviewed with pt, and pt to continue medical treatment as before  Lab Results  Component Value Date   HGBA1C 6.1 02/20/2010   Pt to call for onset polys or cbg >200 on steroid tx

## 2011-08-06 ENCOUNTER — Encounter: Payer: Self-pay | Admitting: *Deleted

## 2011-08-07 ENCOUNTER — Encounter: Payer: Self-pay | Admitting: Gastroenterology

## 2011-09-24 ENCOUNTER — Encounter: Payer: Self-pay | Admitting: Gastroenterology

## 2011-10-28 ENCOUNTER — Ambulatory Visit (AMBULATORY_SURGERY_CENTER): Payer: BC Managed Care – PPO | Admitting: *Deleted

## 2011-10-28 VITALS — Ht 70.0 in | Wt 230.0 lb

## 2011-10-28 DIAGNOSIS — Z1211 Encounter for screening for malignant neoplasm of colon: Secondary | ICD-10-CM

## 2011-10-28 MED ORDER — PEG-KCL-NACL-NASULF-NA ASC-C 100 G PO SOLR: ORAL | Status: DC

## 2011-10-29 ENCOUNTER — Encounter: Payer: Self-pay | Admitting: Gastroenterology

## 2011-11-05 ENCOUNTER — Other Ambulatory Visit: Payer: Self-pay

## 2011-11-05 MED ORDER — LEVOTHYROXINE SODIUM 100 MCG PO TABS: 100.0000 ug | ORAL_TABLET | Freq: Every day | ORAL | Status: DC

## 2011-11-05 MED ORDER — LISINOPRIL 40 MG PO TABS: 40.0000 mg | ORAL_TABLET | Freq: Every day | ORAL | Status: DC

## 2011-11-11 ENCOUNTER — Ambulatory Visit (AMBULATORY_SURGERY_CENTER): Payer: BC Managed Care – PPO | Admitting: Gastroenterology

## 2011-11-11 ENCOUNTER — Encounter: Payer: Self-pay | Admitting: Gastroenterology

## 2011-11-11 VITALS — BP 137/83 | HR 75 | Temp 97.5°F | Resp 18 | Ht 70.0 in | Wt 230.0 lb

## 2011-11-11 DIAGNOSIS — Z1211 Encounter for screening for malignant neoplasm of colon: Secondary | ICD-10-CM

## 2011-11-11 DIAGNOSIS — K5730 Diverticulosis of large intestine without perforation or abscess without bleeding: Secondary | ICD-10-CM

## 2011-11-11 HISTORY — PX: COLONOSCOPY: SHX174

## 2011-11-11 MED ORDER — SODIUM CHLORIDE 0.9 % IV SOLN: 500.0000 mL | INTRAVENOUS | Status: DC

## 2011-11-11 NOTE — Patient Instructions (Signed)
YOU HAD AN ENDOSCOPIC PROCEDURE TODAY AT THE [facility]: Refer to the procedure report that was given to you for any specific questions about what was found during the examination.  If the procedure report does not answer your questions, please call your gastroenterologist to clarify.  If you requested that your care partner not be given the details of your procedure findings, then the procedure report has been included in a sealed envelope for you to review at your convenience later.  YOU SHOULD EXPECT: Some feelings of bloating in the abdomen. Passage of more gas than usual.  Walking can help get rid of the air that was put into your GI tract during the procedure and reduce the bloating. If you had a lower endoscopy (such as a colonoscopy or flexible sigmoidoscopy) you may notice spotting of blood in your stool or on the toilet paper. If you underwent a bowel prep for your procedure, then you may not have a normal bowel movement for a few days.  DIET: Your first meal following the procedure should be a light meal and then it is ok to progress to your normal diet.  A half-sandwich or bowl of soup is an example of a good first meal.  Heavy or fried foods are harder to digest and may make you feel nauseous or bloated.  Likewise meals heavy in dairy and vegetables can cause extra gas to form and this can also increase the bloating.  Drink plenty of fluids but you should avoid alcoholic beverages for 24 hours.  ACTIVITY: Your care partner should take you home directly after the procedure.  You should plan to take it easy, moving slowly for the rest of the day.  You can resume normal activity the day after the procedure however you should NOT DRIVE or use heavy machinery for 24 hours (because of the sedation medicines used during the test).    SYMPTOMS TO REPORT IMMEDIATELY: A gastroenterologist can be reached at any hour.  During normal business hours, 8:30 AM to 5:00 PM Monday through Friday,  call [phone].  After hours and on weekends, please call the GI answering service at [phone] who will take a message and have the physician on call contact you.   Following lower endoscopy (colonoscopy or flexible sigmoidoscopy):  Excessive amounts of blood in the stool  Significant tenderness or worsening of abdominal pains  Swelling of the abdomen that is new, acute  Fever of 100F or higher    FOLLOW UP: If any biopsies were taken you will be contacted by phone or by letter within the next 1-3 weeks.  Call your gastroenterologist if you have not heard about the biopsies in 3 weeks.  Our staff will call the home number listed on your records the next business day following your procedure to check on you and address any questions or concerns that you may have at that time regarding the information given to you following your procedure. This is a courtesy call and so if there is no answer at the home number and we have not heard from you through the emergency physician on call, we will assume that you have returned to your regular daily activities without incident.  SIGNATURES/CONFIDENTIALITY: You and/or your care partner have signed paperwork which will be entered into your electronic medical record.  These signatures attest to the fact that that the information above on your After Visit Summary has been reviewed and is understood.  Full responsibility of the confidentiality   of this discharge information lies with you and/or your care-partner.    Information on diverticulosis & high fiber die given to you today .

## 2011-11-11 NOTE — Progress Notes (Signed)
Patient did not experience any of the following events: a burn prior to discharge; a fall within the facility; wrong site/side/patient/procedure/implant event; or a hospital transfer or hospital admission upon discharge from the facility. (G8907) Patient did not have preoperative order for IV antibiotic SSI prophylaxis. (G8918)  

## 2011-11-11 NOTE — Progress Notes (Signed)
Propofol per crna s camp. See scanned crna intra procedure report. ewm

## 2011-11-11 NOTE — Op Note (Signed)
[facility] [address] [place], [place]  [address]  COLONOSCOPY PROCEDURE REPORT  PATIENT:  Luis Myers, Luis Myers  MR#:  [id number] BIRTHDATE:  Jun 06, 1946, 65 yrs. old  GENDER:  male ENDOSCOPIST:  Vania Rea. Jarold Motto, MD, [facility] REF. BY:  Oliver Barre, M.D. PROCEDURE DATE:  11/11/2011 PROCEDURE:  Average-risk screening colonoscopy G0121 ASA CLASS:  Class II INDICATIONS:  Routine Risk Screening NEGATIVE EXAM 10Y AGO. MEDICATIONS:   propofol (Diprivan) 250 mg IV  DESCRIPTION OF PROCEDURE:   After the risks and benefits and of the procedure were explained, informed consent was obtained. Digital rectal exam was performed and revealed no abnormalities. The LB [id number]-[id number] endoscope was introduced through the anus and advanced to the cecum, which was identified by both the appendix and ileocecal valve.  The quality of the prep was excellent, using MoviPrep.  The instrument was then slowly withdrawn as the colon was fully examined. <<PROCEDUREIMAGES>>  FINDINGS:  There were mild diverticular changes in left colon. diverticulosis was found.  No polyps or cancers were seen.  This was otherwise a normal examination of the colon.   Retroflexed views in the rectum revealed no abnormalities.    The scope was then withdrawn from the patient and the procedure completed.  COMPLICATIONS:  None ENDOSCOPIC IMPRESSION: 1) Diverticulosis,mild,left sided diverticulosis 2) No polyps or cancers 3) Otherwise normal examination RECOMMENDATIONS: 1) Continue current medications 2) High fiber diet. 3) Continue current colorectal screening recommendations for "routine risk" patients with a repeat colonoscopy in 10 years.  REPEAT EXAM:  No  ______________________________ Vania Rea. Jarold Motto, MD, [facility]  CC:  n. eSIGNED:   Vania Rea. Jakevion Arney at 11/11/2011 10:40 AM  Bobbie Stack, [id number]

## 2011-11-12 ENCOUNTER — Telehealth: Payer: Self-pay | Admitting: *Deleted

## 2011-11-12 NOTE — Telephone Encounter (Signed)
  Follow up Call-  Call back number 11/11/2011  Post procedure Call Back phone  # [phone]  Permission to leave phone message Yes     Patient questions:  Do you have a fever, pain , or abdominal swelling? no Pain Score  0 *  Have you tolerated food without any problems? yes  Have you been able to return to your normal activities? yes  Do you have any questions about your discharge instructions: Diet   no Medications  no Follow up visit  no  Do you have questions or concerns about your Care? no  Actions: * If pain score is 4 or above: No action needed, pain <4.

## 2011-12-05 ENCOUNTER — Ambulatory Visit (INDEPENDENT_AMBULATORY_CARE_PROVIDER_SITE_OTHER): Payer: BC Managed Care – PPO | Admitting: Internal Medicine

## 2011-12-05 ENCOUNTER — Encounter: Payer: Self-pay | Admitting: Internal Medicine

## 2011-12-05 ENCOUNTER — Other Ambulatory Visit (INDEPENDENT_AMBULATORY_CARE_PROVIDER_SITE_OTHER): Payer: BC Managed Care – PPO

## 2011-12-05 VITALS — BP 110/70 | HR 61 | Temp 97.0°F | Ht 70.0 in | Wt 234.0 lb

## 2011-12-05 DIAGNOSIS — R7309 Other abnormal glucose: Secondary | ICD-10-CM

## 2011-12-05 DIAGNOSIS — R350 Frequency of micturition: Secondary | ICD-10-CM | POA: Insufficient documentation

## 2011-12-05 DIAGNOSIS — M545 Low back pain, unspecified: Secondary | ICD-10-CM

## 2011-12-05 DIAGNOSIS — F411 Generalized anxiety disorder: Secondary | ICD-10-CM

## 2011-12-05 DIAGNOSIS — Z0000 Encounter for general adult medical examination without abnormal findings: Secondary | ICD-10-CM

## 2011-12-05 DIAGNOSIS — R7302 Impaired glucose tolerance (oral): Secondary | ICD-10-CM

## 2011-12-05 DIAGNOSIS — I10 Essential (primary) hypertension: Secondary | ICD-10-CM

## 2011-12-05 LAB — BASIC METABOLIC PANEL
Calcium: 9.9 mg/dL (ref 8.4–10.5)
GFR: 59.09 mL/min — ABNORMAL LOW (ref >60.00)
Glucose, Bld: 102 mg/dL — ABNORMAL HIGH (ref 70–99)
Sodium: 142 mEq/L (ref 135–145)

## 2011-12-05 LAB — CBC WITH DIFFERENTIAL/PLATELET
Basophils Absolute: 0.1 10*3/uL (ref 0.0–0.1)
Eosinophils Relative: 1.5 % (ref 0.0–5.0)
HCT: 42 % (ref 39.0–52.0)
Hemoglobin: 14.2 g/dL (ref 13.0–17.0)
Lymphs Abs: 1.9 10*3/uL (ref 0.7–4.0)
Monocytes Relative: 9.6 % (ref 3.0–12.0)
Neutro Abs: 3.6 10*3/uL (ref 1.4–7.7)
RDW: 14.1 % (ref 11.5–14.6)

## 2011-12-05 LAB — LIPID PANEL
HDL: 33.9 mg/dL — ABNORMAL LOW (ref >39.00)
Total CHOL/HDL Ratio: 4
Triglycerides: 251 mg/dL — ABNORMAL HIGH (ref 0.0–149.0)
VLDL: 50.2 mg/dL — ABNORMAL HIGH (ref 0.0–40.0)

## 2011-12-05 LAB — HEPATIC FUNCTION PANEL
Albumin: 4.3 g/dL (ref 3.5–5.2)
Alkaline Phosphatase: 56 U/L (ref 39–117)

## 2011-12-05 LAB — URINALYSIS, ROUTINE W REFLEX MICROSCOPIC
Ketones, ur: NEGATIVE
Specific Gravity, Urine: 1.015 (ref 1.000–1.030)
Total Protein, Urine: NEGATIVE
Urine Glucose: NEGATIVE
pH: 6 (ref 5.0–8.0)

## 2011-12-05 LAB — PSA: PSA: 0.43 ng/mL (ref 0.10–4.00)

## 2011-12-05 NOTE — Patient Instructions (Addendum)
You can fax any future labs to [phone] You can take tylenol for pain Please go to LAB in the Basement for the urine tests to be done today You will be contacted by phone if any changes need to be made immediately.  Otherwise, you will receive a letter about your results with an explanation. You will be contacted regarding the referral for: urology Please return in 6 mo with Lab testing done 3-5 days before

## 2011-12-05 NOTE — Progress Notes (Signed)
Subjective:    Patient ID: Luis Myers, male    DOB: 09/14/1945, 66 y.o.   MRN: [id number]  HPI   Here to f/u;     Pt c/o mid LBP mild x 3 days with increaseing soreness lately in the past few days, and wondernig about UTI, has hx of hx of UTI and has mild urinary frequency as well.   Pain is "pullling" on the left but no LE radicular pain. Overal pain worse after sitting and driving over 30 min; nothing makes better.  No bowel or bladder change, fever, wt loss,  worsening LE pain/numbness/weakness, gait change or falls.   Denies urinary symptoms such as dysuria, urgency,or hematuria, other back pain, n/v, chills.  Pt denies chest pain, increased sob or doe, wheezing, orthopnea, PND, increased LE swelling, palpitations, dizziness or syncope.  Denies worsening depressive symptoms, suicidal ideation, or panic, though has ongoing anxiety, not increased recently.  Past Medical History  Diagnosis Date  . Acute gouty arthropathy 11/04/2008  . ALLERGIC RHINITIS 10/07/2007  . ANXIETY 03/04/2007  . BENIGN PROSTATIC HYPERTROPHY 03/04/2007  . CONJUNCTIVITIS, ACUTE, LEFT 11/27/2007  . GLUCOSE INTOLERANCE 04/23/2007  . GOUT 03/04/2007  . HYPERLIPIDEMIA 03/04/2007  . HYPERSOMNIA 02/20/2010  . HYPERTENSION 03/04/2007  . HYPOGONADISM 06/20/2010  . HYPOTHYROIDISM 03/04/2007  . Impaired fasting glucose 04/16/2007  . Morbid obesity 03/04/2007  . PERIPHERAL EDEMA 11/04/2008  . Pure hypercholesterolemia 10/17/2008  . RASH-NONVESICULAR 07/25/2009  . SINUSITIS- ACUTE-NOS 05/16/2008  . URI 11/27/2007  . VITAMIN D DEFICIENCY 06/20/2010  . Impaired glucose tolerance 01/01/2011   Past Surgical History  Procedure Date  . Ureter surgery 1978    Stretched    reports that he has never smoked. He has never used smokeless tobacco. He reports that he does not drink alcohol or use illicit drugs. family history includes Diabetes in his other; Heart disease in his brother and father; and Lung cancer in his father. Allergies  Allergen  Reactions  . Atorvastatin Other (See Comments)    Muscle pain  . Niacin Other (See Comments)    Tachycardia rate 160 beats / minute  . Simvastatin Other (See Comments)    Muscle pain  . Allopurinol Rash   Current Outpatient Prescriptions on File Prior to Visit  Medication Sig Dispense Refill  . aspirin 81 MG tablet Take 81 mg by mouth daily.        . Cholecalciferol (VITAMIN D) 2000 UNITS CAPS Take by mouth daily.        Marland Kitchen levothyroxine (SYNTHROID, LEVOTHROID) 100 MCG tablet Take 1 tablet (100 mcg total) by mouth daily.  90 tablet  1  . lisinopril (PRINIVIL,ZESTRIL) 40 MG tablet Take 1 tablet (40 mg total) by mouth daily.  90 tablet  3  . potassium chloride (KLOR-CON) 10 MEQ CR tablet Take 1 tablet (10 mEq total) by mouth daily as needed.  90 tablet    . rosuvastatin (CRESTOR) 10 MG tablet Take 1 tablet (10 mg total) by mouth daily.  90 tablet  3  . Tamsulosin HCl (FLOMAX) 0.4 MG CAPS Take 1 capsule (0.4 mg total) by mouth daily.  90 capsule  3  . colchicine (COLCRYS) 0.6 MG tablet Take 1 tablet (0.6 mg total) by mouth 2 (two) times daily.  180 tablet  3  . furosemide (LASIX) 20 MG tablet Take 1 tablet (20 mg total) by mouth daily as needed.  90 tablet  3  . indomethacin (INDOCIN) 50 MG capsule Take 50 mg by mouth 3 (three)  times daily as needed.        Review of Systems Review of Systems  Constitutional: Negative for diaphoresis and unexpected weight change.  Eyes: Negative for photophobia and visual disturbance.  Respiratory: Negative for choking and stridor.   Gastrointestinal: Negative for vomiting and blood in stool.  Genitourinary: Negative for hematuria and decreased urine volume.  Musculoskeletal: Negative for gait problem.  Skin: Negative for color change and wound.  Neurological: Negative for tremors and numbness.  Objective:   Physical Exam BP 110/70  Pulse 61  Temp 97 F (36.1 C) (Oral)  Ht 5\' 10"  (1.778 m)  Wt 234 lb (106.142 kg)  BMI 33.58 kg/m2  SpO2  96% Physical Exam  VS noted Constitutional: Pt appears well-developed and well-nourished.  HENT: Head: Normocephalic.  Right Ear: External ear normal.  Left Ear: External ear normal.  Eyes: Conjunctivae and EOM are normal. Pupils are equal, round, and reactive to light.  Neck: Normal range of motion. Neck supple.  Cardiovascular: Normal rate and regular rhythm.   Pulmonary/Chest: Effort normal and breath sounds normal.  Abd:  Soft, NT, non-distended, + BS Spine: nontender, no paravertebral tender Neurological: Pt is alert. No cranial nerve deficit.motor/sens/dtr/gait intact  Skin: Skin is warm. No erythema.  Psychiatric: Pt behavior is normal. Thought content normal.     Assessment & Plan:

## 2011-12-06 ENCOUNTER — Encounter: Payer: Self-pay | Admitting: Internal Medicine

## 2011-12-06 LAB — LDL CHOLESTEROL, DIRECT: Direct LDL: 59.9 mg/dL

## 2011-12-07 NOTE — Assessment & Plan Note (Addendum)
Also for UA today - r/o recurrent UTI,  to f/u any worsening symptoms or concerns, also refer to urology as had likely underlying BPH, per pt reqeust

## 2011-12-07 NOTE — Assessment & Plan Note (Signed)
Hx and exam c/w flare of prob underlying DJD/DDD, for pain control, consider further eval for neuro changes, to f/u any worsening symptoms or concerns

## 2011-12-07 NOTE — Assessment & Plan Note (Signed)
stable overall by hx and exam, most recent data reviewed with pt, and pt to continue medical treatment as before BP Readings from Last 3 Encounters:  12/05/11 110/70  11/11/11 137/83  07/31/11 106/66

## 2011-12-07 NOTE — Assessment & Plan Note (Signed)
stable overall by hx and exam, most recent data reviewed with pt, and pt to continue medical treatment as before Lab Results  Component Value Date   WBC 6.2 12/05/2011   HGB 14.2 12/05/2011   HCT 42.0 12/05/2011   PLT 137.0* 12/05/2011   GLUCOSE 102* 12/05/2011   CHOL 137 12/05/2011   TRIG 251.0* 12/05/2011   HDL 33.90* 12/05/2011   LDLDIRECT 59.9 12/05/2011   LDLCALC 69 04/15/2011   ALT 22 12/05/2011   AST 20 12/05/2011   NA 142 12/05/2011   K 4.6 12/05/2011   CL 105 12/05/2011   CREATININE 1.3 12/05/2011   BUN 14 12/05/2011   CO2 29 12/05/2011   TSH 1.00 12/05/2011   PSA 0.43 12/05/2011   HGBA1C 5.9 12/05/2011

## 2011-12-07 NOTE — Assessment & Plan Note (Signed)
stable overall by hx and exam, most recent data reviewed with pt, and pt to continue medical treatment as before Lab Results  Component Value Date   HGBA1C 5.9 12/05/2011    

## 2012-04-30 ENCOUNTER — Telehealth: Payer: Self-pay

## 2012-04-30 MED ORDER — LEVOTHYROXINE SODIUM 100 MCG PO TABS: 100.0000 ug | ORAL_TABLET | Freq: Every day | ORAL | Status: DC

## 2012-04-30 NOTE — Telephone Encounter (Signed)
Refilled medication

## 2012-05-12 ENCOUNTER — Telehealth: Payer: Self-pay

## 2012-05-12 ENCOUNTER — Other Ambulatory Visit (INDEPENDENT_AMBULATORY_CARE_PROVIDER_SITE_OTHER): Payer: BC Managed Care – PPO

## 2012-05-12 DIAGNOSIS — Z0000 Encounter for general adult medical examination without abnormal findings: Secondary | ICD-10-CM

## 2012-05-12 LAB — CBC WITH DIFFERENTIAL/PLATELET
Basophils Absolute: 0 10*3/uL (ref 0.0–0.1)
Basophils Relative: 0.5 % (ref 0.0–3.0)
Eosinophils Absolute: 0.1 10*3/uL (ref 0.0–0.7)
Eosinophils Relative: 1.5 % (ref 0.0–5.0)
HCT: 43.7 % (ref 39.0–52.0)
Hemoglobin: 14.3 g/dL (ref 13.0–17.0)
Lymphocytes Relative: 29.1 % (ref 12.0–46.0)
Lymphs Abs: 1.7 10*3/uL (ref 0.7–4.0)
MCHC: 32.8 g/dL (ref 30.0–36.0)
MCV: 90.2 fl (ref 78.0–100.0)
Monocytes Absolute: 0.6 10*3/uL (ref 0.1–1.0)
Monocytes Relative: 10 % (ref 3.0–12.0)
Neutro Abs: 3.5 10*3/uL (ref 1.4–7.7)
Neutrophils Relative %: 58.9 % (ref 43.0–77.0)
Platelets: 137 10*3/uL — ABNORMAL LOW (ref 150.0–400.0)
RBC: 4.85 Mil/uL (ref 4.22–5.81)
RDW: 13.8 % (ref 11.5–14.6)
WBC: 5.9 10*3/uL (ref 4.5–10.5)

## 2012-05-12 LAB — URINALYSIS, ROUTINE W REFLEX MICROSCOPIC
Bilirubin Urine: NEGATIVE
Hgb urine dipstick: NEGATIVE
Ketones, ur: NEGATIVE
Nitrite: NEGATIVE
Specific Gravity, Urine: <=1.005 (ref 1.000–1.030)
Total Protein, Urine: NEGATIVE
Urine Glucose: NEGATIVE
Urobilinogen, UA: 0.2 (ref 0.0–1.0)
pH: 6 (ref 5.0–8.0)

## 2012-05-12 LAB — HEPATIC FUNCTION PANEL
ALT: 31 U/L (ref 0–53)
AST: 20 U/L (ref 0–37)
Albumin: 4.5 g/dL (ref 3.5–5.2)
Alkaline Phosphatase: 55 U/L (ref 39–117)
Bilirubin, Direct: 0.2 mg/dL (ref 0.0–0.3)
Total Bilirubin: 1.2 mg/dL (ref 0.3–1.2)
Total Protein: 7.3 g/dL (ref 6.0–8.3)

## 2012-05-12 LAB — TSH: TSH: 1.93 u[IU]/mL (ref 0.35–5.50)

## 2012-05-12 LAB — LIPID PANEL
Cholesterol: 135 mg/dL (ref 0–200)
Triglycerides: 171 mg/dL — ABNORMAL HIGH (ref 0.0–149.0)
VLDL: 34.2 mg/dL (ref 0.0–40.0)

## 2012-05-12 LAB — BASIC METABOLIC PANEL
CO2: 29 mEq/L (ref 19–32)
Chloride: 103 mEq/L (ref 96–112)
Creatinine, Ser: 1.2 mg/dL (ref 0.4–1.5)
Potassium: 5.1 mEq/L (ref 3.5–5.1)
Sodium: 138 mEq/L (ref 135–145)

## 2012-05-12 NOTE — Telephone Encounter (Signed)
Put order in for physical labs. 

## 2012-05-19 ENCOUNTER — Ambulatory Visit (INDEPENDENT_AMBULATORY_CARE_PROVIDER_SITE_OTHER): Payer: BC Managed Care – PPO | Admitting: Internal Medicine

## 2012-05-19 ENCOUNTER — Encounter: Payer: Self-pay | Admitting: Internal Medicine

## 2012-05-19 VITALS — BP 142/90 | HR 71 | Temp 97.4°F | Resp 12 | Ht 69.0 in | Wt 236.0 lb

## 2012-05-19 DIAGNOSIS — R7309 Other abnormal glucose: Secondary | ICD-10-CM

## 2012-05-19 DIAGNOSIS — I10 Essential (primary) hypertension: Secondary | ICD-10-CM

## 2012-05-19 DIAGNOSIS — Z0000 Encounter for general adult medical examination without abnormal findings: Secondary | ICD-10-CM

## 2012-05-19 DIAGNOSIS — R7302 Impaired glucose tolerance (oral): Secondary | ICD-10-CM

## 2012-05-19 NOTE — Patient Instructions (Addendum)
Continue all other medications as before  (no new medications today) Please have the pharmacy call with any refills you may need. Please continue your efforts at being more active, low cholesterol diet, and weight control. You are otherwise up to date with prevention measures Please continue to monitor your blood pressure closely, and call if your BP is more often more than 140/90, as we should add amlodipine 5 mg per day Your EKG and lab work was OK today Thank you for enrolling in MyChart. Please follow the instructions below to securely access your online medical record. MyChart allows you to send messages to your doctor, view your test results, renew your prescriptions, schedule appointments, and more. To Log into MyChart, please go to [email or web address], and your Username is: [email or web address] Please return in 1 year for your yearly visit, or sooner if needed, with Lab testing done 3-5 days before

## 2012-05-19 NOTE — Assessment & Plan Note (Signed)
stable overall by hx and exam, most recent data reviewed with pt, and pt to continue medical treatment as before Lab Results  Component Value Date   HGBA1C 5.9 12/05/2011

## 2012-05-19 NOTE — Assessment & Plan Note (Signed)

## 2012-05-19 NOTE — Progress Notes (Signed)
Subjective:    Patient ID: Luis Myers, male    DOB: [birth year], 66 y.o.   MRN: [id number]  HPI  Here for wellness and f/u;  Overall doing ok;  Pt denies CP, worsening SOB, DOE, wheezing, orthopnea, PND, worsening LE edema, palpitations, dizziness or syncope.  Pt denies neurological change such as new Headache, facial or extremity weakness.  Pt denies polydipsia, polyuria, or low sugar symptoms. Pt states overall good compliance with treatment and medications, good tolerability, and trying to follow lower cholesterol diet.  Pt denies worsening depressive symptoms, suicidal ideation or panic. No fever, wt loss, night sweats, loss of appetite, or other constitutional symptoms.  Pt states good ability with ADL's, low fall risk, home safety reviewed and adequate, no significant changes in hearing or vision, and occasionally active with exercise.  Did see urology approx 2 wks ago with urinary urency, now resolved, no infection per pt, started on flomax.  BP has been higher lately such as today at work.  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   Past Medical History  Diagnosis Date  . Acute gouty arthropathy 11/04/2008  . ALLERGIC RHINITIS 10/07/2007  . ANXIETY 03/04/2007  . BENIGN PROSTATIC HYPERTROPHY 03/04/2007  . CONJUNCTIVITIS, ACUTE, LEFT 11/27/2007  . GLUCOSE INTOLERANCE 04/23/2007  . GOUT 03/04/2007  . HYPERLIPIDEMIA 03/04/2007  . HYPERSOMNIA 02/20/2010  . HYPERTENSION 03/04/2007  . HYPOGONADISM 06/20/2010  . HYPOTHYROIDISM 03/04/2007  . Impaired fasting glucose 04/16/2007  . Morbid obesity 03/04/2007  . PERIPHERAL EDEMA 11/04/2008  . Pure hypercholesterolemia 10/17/2008  . RASH-NONVESICULAR 07/25/2009  . SINUSITIS- ACUTE-NOS 05/16/2008  . URI 11/27/2007  . VITAMIN D DEFICIENCY 06/20/2010  . Impaired glucose tolerance 01/01/2011   Past Surgical History  Procedure Date  . Ureter surgery 1978    Stretched    reports that he has never smoked. He has never used smokeless  tobacco. He reports that he does not drink alcohol or use illicit drugs. family history includes Diabetes in his other; Heart disease in his brother and father; and Lung cancer in his father. Allergies  Allergen Reactions  . Atorvastatin Other (See Comments)    Muscle pain  . Niacin Other (See Comments)    Tachycardia rate 160 beats / minute  . Simvastatin Other (See Comments)    Muscle pain  . Allopurinol Rash   Current Outpatient Prescriptions on File Prior to Visit  Medication Sig Dispense Refill  . aspirin 81 MG tablet Take 81 mg by mouth daily.        . Cholecalciferol (VITAMIN D) 2000 UNITS CAPS Take by mouth daily.        . colchicine (COLCRYS) 0.6 MG tablet Take 1 tablet (0.6 mg total) by mouth 2 (two) times daily.  180 tablet  3  . levothyroxine (SYNTHROID, LEVOTHROID) 100 MCG tablet Take 1 tablet (100 mcg total) by mouth daily.  90 tablet  2  . lisinopril (PRINIVIL,ZESTRIL) 40 MG tablet Take 1 tablet (40 mg total) by mouth daily.  90 tablet  3  . rosuvastatin (CRESTOR) 10 MG tablet Take 1 tablet (10 mg total) by mouth daily.  90 tablet  3  . Tamsulosin HCl (FLOMAX) 0.4 MG CAPS Take 1 capsule (0.4 mg total) by mouth daily.  90 capsule  3  . furosemide (LASIX) 20 MG tablet Take 1 tablet (20 mg total) by mouth daily as needed.  90 tablet  3  . indomethacin (INDOCIN) 50 MG capsule Take 50 mg by mouth 3 (three)  times daily as needed.       . potassium chloride (KLOR-CON) 10 MEQ CR tablet Take 1 tablet (10 mEq total) by mouth daily as needed.  90 tablet     Review of Systems Review of Systems  Constitutional: Negative for diaphoresis, activity change, appetite change and unexpected weight change.  HENT: Negative for hearing loss, ear pain, facial swelling, mouth sores and neck stiffness.   Eyes: Negative for pain, redness and visual disturbance.  Respiratory: Negative for shortness of breath and wheezing.   Cardiovascular: Negative for chest pain and palpitations.   Gastrointestinal: Negative for diarrhea, blood in stool, abdominal distention and rectal pain.  Genitourinary: Negative for hematuria, flank pain and decreased urine volume.  Musculoskeletal: Negative for myalgias and joint swelling.  Skin: Negative for color change and wound.  Neurological: Negative for syncope and numbness.  Hematological: Negative for adenopathy.  Psychiatric/Behavioral: Negative for hallucinations, self-injury, decreased concentration and agitation.      Objective:   Physical Exam BP 142/90  Pulse 71  Temp 97.4 F (36.3 C) (Oral)  Resp 12  Ht 5\' 9"  (1.753 m)  Wt 236 lb (107.049 kg)  BMI 34.85 kg/m2  SpO2 97% Physical Exam  VS noted Constitutional: Pt is oriented to person, place, and time. Appears well-developed and well-nourished.  HENT:  Head: Normocephalic and atraumatic.  Right Ear: External ear normal.  Left Ear: External ear normal.  Nose: Nose normal.  Mouth/Throat: Oropharynx is clear and moist.  Eyes: Conjunctivae and EOM are normal. Pupils are equal, round, and reactive to light.  Neck: Normal range of motion. Neck supple. No JVD present. No tracheal deviation present.  Cardiovascular: Normal rate, regular rhythm, normal heart sounds and intact distal pulses.   Pulmonary/Chest: Effort normal and breath sounds normal.  Abdominal: Soft. Bowel sounds are normal. There is no tenderness.  Musculoskeletal: Normal range of motion. Exhibits no edema.  Lymphadenopathy:  Has no cervical adenopathy.  Neurological: Pt is alert and oriented to person, place, and time. Pt has normal reflexes. No cranial nerve deficit.  Skin: Skin is warm and dry. No rash noted.  Psychiatric:  Has  normal mood and affect. Behavior is normal.     Assessment & Plan:

## 2012-05-19 NOTE — Assessment & Plan Note (Signed)
stable overall by hx and exam, most recent data reviewed with pt, and pt to continue medical treatment as before BP Readings from Last 3 Encounters:  05/19/12 142/90  12/05/11 110/70  11/11/11 137/83   To continue to monitor for persistent elevation

## 2012-05-22 ENCOUNTER — Other Ambulatory Visit: Payer: Self-pay

## 2012-05-22 MED ORDER — ROSUVASTATIN CALCIUM 10 MG PO TABS: 10.0000 mg | ORAL_TABLET | Freq: Every day | ORAL | Status: DC

## 2012-08-26 ENCOUNTER — Telehealth: Payer: Self-pay

## 2012-08-26 NOTE — Telephone Encounter (Signed)
Ok for PA on crestor as he has been intol of lipitor and zocor in the past due to muscle pain

## 2012-08-26 NOTE — Telephone Encounter (Signed)
Called [phone] to have form faxed.  Will complete and fax back to [facility].

## 2012-08-26 NOTE — Telephone Encounter (Signed)
PA on Crestor 10 mg please advise

## 2012-08-28 ENCOUNTER — Telehealth: Payer: Self-pay

## 2012-08-28 NOTE — Telephone Encounter (Signed)
PA Approval for Crestor from 08/27/2012 through 05/23/2015.  Patient and pharmacy informed.

## 2012-11-19 ENCOUNTER — Other Ambulatory Visit: Payer: Self-pay

## 2012-11-19 MED ORDER — LISINOPRIL 40 MG PO TABS: 40.0000 mg | ORAL_TABLET | Freq: Every day | ORAL | Status: DC

## 2012-11-30 ENCOUNTER — Other Ambulatory Visit: Payer: Self-pay

## 2012-11-30 DIAGNOSIS — M109 Gout, unspecified: Secondary | ICD-10-CM

## 2012-11-30 MED ORDER — LEVOTHYROXINE SODIUM 100 MCG PO TABS: 100.0000 ug | ORAL_TABLET | Freq: Every day | ORAL | Status: DC

## 2012-11-30 MED ORDER — COLCHICINE 0.6 MG PO TABS: 0.6000 mg | ORAL_TABLET | Freq: Two times a day (BID) | ORAL | Status: DC

## 2013-03-26 ENCOUNTER — Telehealth: Payer: Self-pay

## 2013-03-26 DIAGNOSIS — Z0000 Encounter for general adult medical examination without abnormal findings: Secondary | ICD-10-CM

## 2013-03-26 NOTE — Telephone Encounter (Signed)
CPX labs entered  

## 2013-04-15 ENCOUNTER — Other Ambulatory Visit: Payer: Self-pay

## 2013-07-05 ENCOUNTER — Other Ambulatory Visit (INDEPENDENT_AMBULATORY_CARE_PROVIDER_SITE_OTHER): Payer: BC Managed Care – PPO

## 2013-07-05 DIAGNOSIS — Z0000 Encounter for general adult medical examination without abnormal findings: Secondary | ICD-10-CM

## 2013-07-05 LAB — CBC WITH DIFFERENTIAL/PLATELET
BASOS PCT: 0.5 % (ref 0.0–3.0)
Basophils Absolute: 0 10*3/uL (ref 0.0–0.1)
EOS ABS: 0.1 10*3/uL (ref 0.0–0.7)
Eosinophils Relative: 1.3 % (ref 0.0–5.0)
HCT: 41.2 % (ref 39.0–52.0)
HEMOGLOBIN: 13.8 g/dL (ref 13.0–17.0)
Lymphocytes Relative: 35.9 % (ref 12.0–46.0)
Lymphs Abs: 2 10*3/uL (ref 0.7–4.0)
MCHC: 33.5 g/dL (ref 30.0–36.0)
MCV: 87.8 fl (ref 78.0–100.0)
MONO ABS: 0.6 10*3/uL (ref 0.1–1.0)
Monocytes Relative: 11.3 % (ref 3.0–12.0)
Neutro Abs: 2.9 10*3/uL (ref 1.4–7.7)
Neutrophils Relative %: 51 % (ref 43.0–77.0)
Platelets: 143 10*3/uL — ABNORMAL LOW (ref 150.0–400.0)
RBC: 4.69 Mil/uL (ref 4.22–5.81)
RDW: 14.5 % (ref 11.5–14.6)
WBC: 5.7 10*3/uL (ref 4.5–10.5)

## 2013-07-05 LAB — URINALYSIS, ROUTINE W REFLEX MICROSCOPIC
BILIRUBIN URINE: NEGATIVE
Hgb urine dipstick: NEGATIVE
Ketones, ur: NEGATIVE
Leukocytes, UA: NEGATIVE
Nitrite: NEGATIVE
RBC / HPF: NONE SEEN (ref 0–?)
Specific Gravity, Urine: <=1.005 — AB (ref 1.000–1.030)
TOTAL PROTEIN, URINE-UPE24: NEGATIVE
UROBILINOGEN UA: 0.2 (ref 0.0–1.0)
Urine Glucose: NEGATIVE
WBC, UA: NONE SEEN (ref 0–?)
pH: 6 (ref 5.0–8.0)

## 2013-07-05 LAB — BASIC METABOLIC PANEL
BUN: 15 mg/dL (ref 6–23)
CHLORIDE: 107 meq/L (ref 96–112)
CO2: 28 mEq/L (ref 19–32)
Calcium: 9.5 mg/dL (ref 8.4–10.5)
Creatinine, Ser: 1.3 mg/dL (ref 0.4–1.5)
GFR: 58.28 mL/min — AB (ref >60.00)
Glucose, Bld: 104 mg/dL — ABNORMAL HIGH (ref 70–99)
Potassium: 5 mEq/L (ref 3.5–5.1)
Sodium: 142 mEq/L (ref 135–145)

## 2013-07-05 LAB — PSA: PSA: 0.18 ng/mL (ref 0.10–4.00)

## 2013-07-05 LAB — TSH: TSH: 1.71 u[IU]/mL (ref 0.35–5.50)

## 2013-07-05 LAB — HEPATIC FUNCTION PANEL
ALT: 22 U/L (ref 0–53)
AST: 18 U/L (ref 0–37)
Albumin: 4.1 g/dL (ref 3.5–5.2)
Alkaline Phosphatase: 54 U/L (ref 39–117)
BILIRUBIN TOTAL: 0.9 mg/dL (ref 0.3–1.2)
Bilirubin, Direct: 0.1 mg/dL (ref 0.0–0.3)
Total Protein: 7.1 g/dL (ref 6.0–8.3)

## 2013-07-05 LAB — LIPID PANEL
CHOL/HDL RATIO: 3
CHOLESTEROL: 138 mg/dL (ref 0–200)
HDL: 40.2 mg/dL (ref >39.00)
LDL Cholesterol: 71 mg/dL (ref 0–99)
TRIGLYCERIDES: 133 mg/dL (ref 0.0–149.0)
VLDL: 26.6 mg/dL (ref 0.0–40.0)

## 2013-07-13 ENCOUNTER — Encounter: Payer: Self-pay | Admitting: Internal Medicine

## 2013-07-13 ENCOUNTER — Ambulatory Visit (INDEPENDENT_AMBULATORY_CARE_PROVIDER_SITE_OTHER): Payer: BC Managed Care – PPO | Admitting: Internal Medicine

## 2013-07-13 VITALS — BP 140/82 | HR 75 | Temp 97.4°F | Ht 70.0 in | Wt 238.2 lb

## 2013-07-13 DIAGNOSIS — Z23 Encounter for immunization: Secondary | ICD-10-CM

## 2013-07-13 DIAGNOSIS — Z136 Encounter for screening for cardiovascular disorders: Secondary | ICD-10-CM

## 2013-07-13 DIAGNOSIS — Z0000 Encounter for general adult medical examination without abnormal findings: Secondary | ICD-10-CM

## 2013-07-13 MED ORDER — FINASTERIDE 5 MG PO TABS: 5.0000 mg | ORAL_TABLET | Freq: Every day | ORAL | Status: DC

## 2013-07-13 MED ORDER — ROSUVASTATIN CALCIUM 10 MG PO TABS
10.0000 mg | ORAL_TABLET | Freq: Every day | ORAL | Status: DC
Start: 2013-07-13 — End: 2014-08-15

## 2013-07-13 NOTE — Progress Notes (Signed)
Subjective:    Patient ID: Luis Myers, male    DOB: 1945/11/08, 68 y.o.   MRN: [id number]  HPI  Here for wellness and f/u;  Overall doing ok;  Pt denies CP, worsening SOB, DOE, wheezing, orthopnea, PND, worsening LE edema, palpitations, dizziness or syncope.  Pt denies neurological change such as new headache, facial or extremity weakness.  Pt denies polydipsia, polyuria, or low sugar symptoms. Pt states overall good compliance with treatment and medications, good tolerability, and has been trying to follow lower cholesterol diet.  Pt denies worsening depressive symptoms, suicidal ideation or panic. No fever, night sweats, wt loss, loss of appetite, or other constitutional symptoms.  Pt states good ability with ADL's, has low fall risk, home safety reviewed and adequate, no other significant changes in hearing or vision, and only occasionally active with exercise. Plans to try to lose 2 lbs in the next 2 mo to satisfy his work wellness program to get the less expensive rate Past Medical History  Diagnosis Date  . Acute gouty arthropathy 11/04/2008  . ALLERGIC RHINITIS 10/07/2007  . ANXIETY 03/04/2007  . BENIGN PROSTATIC HYPERTROPHY 03/04/2007  . CONJUNCTIVITIS, ACUTE, LEFT 11/27/2007  . GLUCOSE INTOLERANCE 04/23/2007  . GOUT 03/04/2007  . HYPERLIPIDEMIA 03/04/2007  . HYPERSOMNIA 02/20/2010  . HYPERTENSION 03/04/2007  . HYPOGONADISM 06/20/2010  . HYPOTHYROIDISM 03/04/2007  . Impaired fasting glucose 04/16/2007  . Morbid obesity 03/04/2007  . PERIPHERAL EDEMA 11/04/2008  . Pure hypercholesterolemia 10/17/2008  . RASH-NONVESICULAR 07/25/2009  . SINUSITIS- ACUTE-NOS 05/16/2008  . URI 11/27/2007  . VITAMIN D DEFICIENCY 06/20/2010  . Impaired glucose tolerance 01/01/2011   Past Surgical History  Procedure Laterality Date  . Ureter surgery  1978    Stretched    reports that he has never smoked. He has never used smokeless tobacco. He reports that he does not drink alcohol or use illicit drugs. family  history includes Diabetes in his other; Heart disease in his brother and father; Lung cancer in his father. Allergies  Allergen Reactions  . Atorvastatin Other (See Comments)    Muscle pain  . Niacin Other (See Comments)    Tachycardia rate 160 beats / minute  . Simvastatin Other (See Comments)    Muscle pain  . Allopurinol Rash   Current Outpatient Prescriptions on File Prior to Visit  Medication Sig Dispense Refill  . aspirin 81 MG tablet Take 81 mg by mouth daily.        . Cholecalciferol (VITAMIN D) 2000 UNITS CAPS Take by mouth daily.        . colchicine (COLCRYS) 0.6 MG tablet Take 1 tablet (0.6 mg total) by mouth 2 (two) times daily.  180 tablet  3  . indomethacin (INDOCIN) 50 MG capsule Take 50 mg by mouth 3 (three) times daily as needed.       Marland Kitchen levothyroxine (SYNTHROID, LEVOTHROID) 100 MCG tablet Take 1 tablet (100 mcg total) by mouth daily.  90 tablet  2  . lisinopril (PRINIVIL,ZESTRIL) 40 MG tablet Take 1 tablet (40 mg total) by mouth daily.  90 tablet  3  . Tamsulosin HCl (FLOMAX) 0.4 MG CAPS Take 1 capsule (0.4 mg total) by mouth daily.  90 capsule  3   No current facility-administered medications on file prior to visit.    Review of Systems Constitutional: Negative for diaphoresis, activity change, appetite change or unexpected weight change.  HENT: Negative for hearing loss, ear pain, facial swelling, mouth sores and neck stiffness.   Eyes: Negative for  pain, redness and visual disturbance.  Respiratory: Negative for shortness of breath and wheezing.   Cardiovascular: Negative for chest pain and palpitations.  Gastrointestinal: Negative for diarrhea, blood in stool, abdominal distention or other pain Genitourinary: Negative for hematuria, flank pain or change in urine volume.  Musculoskeletal: Negative for myalgias and joint swelling.  Skin: Negative for color change and wound.  Neurological: Negative for syncope and numbness. other than noted Hematological:  Negative for adenopathy.  Psychiatric/Behavioral: Negative for hallucinations, self-injury, decreased concentration and agitation.      Objective:   Physical Exam BP 140/82  Pulse 75  Temp(Src) 97.4 F (36.3 C) (Oral)  Ht 5\' 10"  (1.778 m)  Wt 238 lb 4 oz (108.069 kg)  BMI 34.19 kg/m2  SpO2 97% VS noted,  Constitutional: Pt is oriented to person, place, and time. Appears well-developed and well-nourished.  Head: Normocephalic and atraumatic.  Right Ear: External ear normal.  Left Ear: External ear normal.  Nose: Nose normal.  Mouth/Throat: Oropharynx is clear and moist.  Eyes: Conjunctivae and EOM are normal. Pupils are equal, round, and reactive to light.  Neck: Normal range of motion. Neck supple. No JVD present. No tracheal deviation present.  Cardiovascular: Normal rate, regular rhythm, normal heart sounds and intact distal pulses.   Pulmonary/Chest: Effort normal and breath sounds normal.  Abdominal: Soft. Bowel sounds are normal. There is no tenderness. No HSM  Musculoskeletal: Normal range of motion. Exhibits no edema.  Lymphadenopathy:  Has no cervical adenopathy.  Neurological: Pt is alert and oriented to person, place, and time. Pt has normal reflexes. No cranial nerve deficit.  Skin: Skin is warm and dry. No rash noted.  Psychiatric:  Has  normal mood and affect. Behavior is normal.      Assessment & Plan:

## 2013-07-13 NOTE — Progress Notes (Signed)
Pre-visit discussion using our clinic review tool. No additional management support is needed unless otherwise documented below in the visit note.  

## 2013-07-13 NOTE — Addendum Note (Signed)
Addended by: Sharon Seller B on: 07/13/2013 02:40 PM   Modules accepted: Orders

## 2013-07-13 NOTE — Patient Instructions (Addendum)
You had the tetanus (TdaP) and the new Prevnar pneumonia shot today Please continue all other medications as before, and refills have been done if requested. Please have the pharmacy call with any other refills you may need. Please continue your efforts at being more active, low cholesterol diet, and weight control. You are otherwise up to date with prevention measures today. Please keep your appointments with your specialists as you may have planned Your EKG was OK today, and you are given the lab results copy  Please remember to sign up for My Chart if you have not done so, as this will be important to you in the future with finding out test results, communicating by private email, and scheduling acute appointments online when needed.  Please return in 1 year for your yearly visit, or sooner if needed, with Lab testing done 3-5 days before

## 2013-07-13 NOTE — Assessment & Plan Note (Signed)

## 2013-10-13 ENCOUNTER — Other Ambulatory Visit: Payer: Self-pay

## 2013-10-13 MED ORDER — LEVOTHYROXINE SODIUM 100 MCG PO TABS: 100.0000 ug | ORAL_TABLET | Freq: Every day | ORAL | Status: DC

## 2013-10-13 MED ORDER — LISINOPRIL 40 MG PO TABS
40.0000 mg | ORAL_TABLET | Freq: Every day | ORAL | Status: DC
Start: 2013-10-13 — End: 2014-10-03

## 2013-12-07 ENCOUNTER — Other Ambulatory Visit: Payer: Self-pay | Admitting: Internal Medicine

## 2014-07-06 ENCOUNTER — Other Ambulatory Visit: Payer: Self-pay | Admitting: Internal Medicine

## 2014-07-19 ENCOUNTER — Other Ambulatory Visit (INDEPENDENT_AMBULATORY_CARE_PROVIDER_SITE_OTHER): Payer: BLUE CROSS/BLUE SHIELD

## 2014-07-19 ENCOUNTER — Other Ambulatory Visit: Payer: Self-pay | Admitting: Internal Medicine

## 2014-07-19 DIAGNOSIS — E785 Hyperlipidemia, unspecified: Secondary | ICD-10-CM

## 2014-07-19 DIAGNOSIS — Z0000 Encounter for general adult medical examination without abnormal findings: Secondary | ICD-10-CM | POA: Diagnosis not present

## 2014-07-19 LAB — BASIC METABOLIC PANEL
BUN: 15 mg/dL (ref 6–23)
CALCIUM: 9.6 mg/dL (ref 8.4–10.5)
CO2: 29 mEq/L (ref 19–32)
Chloride: 104 mEq/L (ref 96–112)
Creatinine, Ser: 1.24 mg/dL (ref 0.40–1.50)
GFR: 61.9 mL/min (ref >60.00)
Glucose, Bld: 99 mg/dL (ref 70–99)
Potassium: 4.6 mEq/L (ref 3.5–5.1)
SODIUM: 139 meq/L (ref 135–145)

## 2014-07-19 LAB — LIPID PANEL
Cholesterol: 146 mg/dL (ref 0–200)
HDL: 40.5 mg/dL (ref >39.00)
NonHDL: 105.5
Total CHOL/HDL Ratio: 4
Triglycerides: 216 mg/dL — ABNORMAL HIGH (ref 0.0–149.0)
VLDL: 43.2 mg/dL — AB (ref 0.0–40.0)

## 2014-07-19 LAB — CBC WITH DIFFERENTIAL/PLATELET
BASOS ABS: 0 10*3/uL (ref 0.0–0.1)
Basophils Relative: 0.5 % (ref 0.0–3.0)
EOS PCT: 2.5 % (ref 0.0–5.0)
Eosinophils Absolute: 0.1 10*3/uL (ref 0.0–0.7)
HEMATOCRIT: 44.2 % (ref 39.0–52.0)
Hemoglobin: 14.9 g/dL (ref 13.0–17.0)
LYMPHS ABS: 1.9 10*3/uL (ref 0.7–4.0)
Lymphocytes Relative: 33.4 % (ref 12.0–46.0)
MCHC: 33.7 g/dL (ref 30.0–36.0)
MCV: 87.4 fl (ref 78.0–100.0)
Monocytes Absolute: 0.5 10*3/uL (ref 0.1–1.0)
Monocytes Relative: 9.5 % (ref 3.0–12.0)
NEUTROS PCT: 54.1 % (ref 43.0–77.0)
Neutro Abs: 3.1 10*3/uL (ref 1.4–7.7)
Platelets: 154 10*3/uL (ref 150.0–400.0)
RBC: 5.05 Mil/uL (ref 4.22–5.81)
RDW: 14.4 % (ref 11.5–15.5)
WBC: 5.7 10*3/uL (ref 4.0–10.5)

## 2014-07-19 LAB — URINALYSIS
BILIRUBIN URINE: NEGATIVE
HGB URINE DIPSTICK: NEGATIVE
Ketones, ur: NEGATIVE
LEUKOCYTES UA: NEGATIVE
Nitrite: NEGATIVE
Specific Gravity, Urine: <=1.005 — AB (ref 1.000–1.030)
Total Protein, Urine: NEGATIVE
Urine Glucose: NEGATIVE
Urobilinogen, UA: 0.2 (ref 0.0–1.0)
pH: 6 (ref 5.0–8.0)

## 2014-07-19 LAB — TSH: TSH: 2.18 u[IU]/mL (ref 0.35–4.50)

## 2014-07-19 LAB — MICROALBUMIN / CREATININE URINE RATIO
Creatinine,U: 40.3 mg/dL
Microalb Creat Ratio: 1.7 mg/g (ref 0.0–30.0)

## 2014-07-19 LAB — LDL CHOLESTEROL, DIRECT: LDL DIRECT: 74 mg/dL

## 2014-07-19 LAB — HEMOGLOBIN A1C: HEMOGLOBIN A1C: 6 % (ref 4.6–6.5)

## 2014-07-19 LAB — HEPATIC FUNCTION PANEL
ALT: 15 U/L (ref 0–53)
AST: 15 U/L (ref 0–37)
Albumin: 4.4 g/dL (ref 3.5–5.2)
Alkaline Phosphatase: 61 U/L (ref 39–117)
BILIRUBIN DIRECT: 0.1 mg/dL (ref 0.0–0.3)
BILIRUBIN TOTAL: 0.7 mg/dL (ref 0.2–1.2)
Total Protein: 7.2 g/dL (ref 6.0–8.3)

## 2014-07-19 LAB — PSA: PSA: 0.17 ng/mL (ref 0.10–4.00)

## 2014-07-26 ENCOUNTER — Encounter: Payer: Self-pay | Admitting: Internal Medicine

## 2014-07-26 ENCOUNTER — Ambulatory Visit (INDEPENDENT_AMBULATORY_CARE_PROVIDER_SITE_OTHER)
Admission: RE | Admit: 2014-07-26 | Discharge: 2014-07-26 | Disposition: A | Discharge status: Home / Self Care | Payer: BLUE CROSS/BLUE SHIELD | Source: Ambulatory Visit | Attending: Internal Medicine | Admitting: Internal Medicine

## 2014-07-26 ENCOUNTER — Ambulatory Visit (INDEPENDENT_AMBULATORY_CARE_PROVIDER_SITE_OTHER): Payer: BLUE CROSS/BLUE SHIELD | Admitting: Internal Medicine

## 2014-07-26 VITALS — BP 140/80 | HR 64 | Temp 98.6°F | Ht 69.5 in | Wt 231.0 lb

## 2014-07-26 DIAGNOSIS — M25551 Pain in right hip: Secondary | ICD-10-CM

## 2014-07-26 DIAGNOSIS — N529 Male erectile dysfunction, unspecified: Secondary | ICD-10-CM

## 2014-07-26 DIAGNOSIS — E785 Hyperlipidemia, unspecified: Secondary | ICD-10-CM

## 2014-07-26 DIAGNOSIS — Z0000 Encounter for general adult medical examination without abnormal findings: Secondary | ICD-10-CM

## 2014-07-26 MED ORDER — SILDENAFIL CITRATE 100 MG PO TABS: 50.0000 mg | ORAL_TABLET | Freq: Every day | ORAL | Status: DC | PRN

## 2014-07-26 NOTE — Assessment & Plan Note (Signed)
[place] for viagra trial,  to f/u any worsening symptoms or concerns

## 2014-07-26 NOTE — Progress Notes (Signed)
Subjective:    Patient ID: Luis Myers, male    DOB: 1946-04-24, 69 y.o.   MRN: [id number]  HPI  Here for wellness and f/u;  Overall doing ok;  Pt denies CP, worsening SOB, DOE, wheezing, orthopnea, PND, worsening LE edema, palpitations, dizziness or syncope.  Pt denies neurological change such as new headache, facial or extremity weakness.  Pt denies polydipsia, polyuria, or low sugar symptoms. Pt states overall good compliance with treatment and medications, good tolerability, and has been trying to follow lower cholesterol diet.  Pt denies worsening depressive symptoms, suicidal ideation or panic. No fever, night sweats, wt loss, loss of appetite, or other constitutional symptoms.  Pt states good ability with ADL's, has low fall risk, home safety reviewed and adequate, no other significant changes in hearing or vision, and only occasionally active with exercise.  Does c/o right lateral hip pain, worse to stand up and stand, but not as much to walk, no lower back pain.  Asks for ED med trial such as viagra fo rworsening symtpoms last 6 mo Past Medical History  Diagnosis Date  . Acute gouty arthropathy 11/04/2008  . ALLERGIC RHINITIS 10/07/2007  . ANXIETY 03/04/2007  . BENIGN PROSTATIC HYPERTROPHY 03/04/2007  . CONJUNCTIVITIS, ACUTE, LEFT 11/27/2007  . GLUCOSE INTOLERANCE 04/23/2007  . GOUT 03/04/2007  . HYPERLIPIDEMIA 03/04/2007  . HYPERSOMNIA 02/20/2010  . HYPERTENSION 03/04/2007  . HYPOGONADISM 06/20/2010  . HYPOTHYROIDISM 03/04/2007  . Impaired fasting glucose 04/16/2007  . Morbid obesity 03/04/2007  . PERIPHERAL EDEMA 11/04/2008  . Pure hypercholesterolemia 10/17/2008  . RASH-NONVESICULAR 07/25/2009  . SINUSITIS- ACUTE-NOS 05/16/2008  . URI 11/27/2007  . VITAMIN D DEFICIENCY 06/20/2010  . Impaired glucose tolerance 01/01/2011   Past Surgical History  Procedure Laterality Date  . Ureter surgery  1978    Stretched    reports that he has never smoked. He has never used smokeless tobacco. He  reports that he does not drink alcohol or use illicit drugs. family history includes Diabetes in his other; Heart disease in his brother and father; Lung cancer in his father. Allergies  Allergen Reactions  . Atorvastatin Other (See Comments)    Muscle pain  . Niacin Other (See Comments)    Tachycardia rate 160 beats / minute  . Simvastatin Other (See Comments)    Muscle pain  . Allopurinol Rash   Current Outpatient Prescriptions on File Prior to Visit  Medication Sig Dispense Refill  . aspirin 81 MG tablet Take 81 mg by mouth daily.      . Cholecalciferol (VITAMIN D) 2000 UNITS CAPS Take by mouth daily.      Marland Kitchen COLCRYS 0.6 MG tablet TAKE 1 BY MOUTH TWICE DAILY 180 tablet 0  . finasteride (PROSCAR) 5 MG tablet TAKE 1 BY MOUTH DAILY 90 tablet 0  . indomethacin (INDOCIN) 50 MG capsule Take 50 mg by mouth 3 (three) times daily as needed.     Marland Kitchen levothyroxine (SYNTHROID, LEVOTHROID) 100 MCG tablet Take 1 tablet (100 mcg total) by mouth daily. 90 tablet 3  . lisinopril (PRINIVIL,ZESTRIL) 40 MG tablet Take 1 tablet (40 mg total) by mouth daily. 90 tablet 3  . rosuvastatin (CRESTOR) 10 MG tablet Take 1 tablet (10 mg total) by mouth daily. 90 tablet 3  . Tamsulosin HCl (FLOMAX) 0.4 MG CAPS Take 1 capsule (0.4 mg total) by mouth daily. 90 capsule 3   No current facility-administered medications on file prior to visit.    Review of Systems Constitutional: Negative for increased diaphoresis,  other activity, appetite or other siginficant weight change  HENT: Negative for worsening hearing loss, ear pain, facial swelling, mouth sores and neck stiffness.   Eyes: Negative for other worsening pain, redness or visual disturbance.  Respiratory: Negative for shortness of breath and wheezing.   Cardiovascular: Negative for chest pain and palpitations.  Gastrointestinal: Negative for diarrhea, blood in stool, abdominal distention or other pain Genitourinary: Negative for hematuria, flank pain or change in  urine volume.  Musculoskeletal: Negative for myalgias or other joint complaints.  Skin: Negative for color change and wound.  Neurological: Negative for syncope and numbness. other than noted Hematological: Negative for adenopathy. or other swelling Psychiatric/Behavioral: Negative for hallucinations, self-injury, decreased concentration or other worsening agitation.      Objective:   Physical Exam BP 140/80 mmHg  Pulse 64  Temp(Src) 98.6 F (37 C) (Oral)  Ht 5' 9.5" (1.765 m)  Wt 231 lb (104.781 kg)  BMI 33.64 kg/m2 VS noted,  Constitutional: Pt is oriented to person, place, and time. Appears well-developed and well-nourished.  Head: Normocephalic and atraumatic.  Right Ear: External ear normal.  Left Ear: External ear normal.  Nose: Nose normal.  Mouth/Throat: Oropharynx is clear and moist.  Eyes: Conjunctivae and EOM are normal. Pupils are equal, round, and reactive to light.  Neck: Normal range of motion. Neck supple. No JVD present. No tracheal deviation present.  Cardiovascular: Normal rate, regular rhythm, normal heart sounds and intact distal pulses.   Pulmonary/Chest: Effort normal and breath sounds without rales or wheezing  Abdominal: Soft. Bowel sounds are normal. NT. No HSM  Musculoskeletal: Normal range of motion. Exhibits no edema.  Lymphadenopathy:  Has no cervical adenopathy.  Neurological: Pt is alert and oriented to person, place, and time. Pt has normal reflexes. No cranial nerve deficit. Motor grossly intact Skin: Skin is warm and dry. No rash noted.  Spine: nontender Right hip with stifness, mild discomfort with int rotation No breast masses, swelling, d/c or skin change Psychiatric:  Has normal mood and affect. Behavior is normal.      Assessment & Plan:

## 2014-07-26 NOTE — Assessment & Plan Note (Addendum)
?   Bursitis vs msk , persitent ongoing, for tyleno prn, also refer Dr Smith/sport med, film today

## 2014-07-26 NOTE — Assessment & Plan Note (Signed)

## 2014-07-26 NOTE — Patient Instructions (Signed)
Your EKG was OK today  Please take all new medication as prescribed  - the viagra  Please continue all other medications as before, and refills have been done if requested.  Please have the pharmacy call with any other refills you may need.  Please continue your efforts at being more active, low cholesterol diet, and weight control.  You are otherwise up to date with prevention measures today.  Please keep your appointments with your specialists as you may have planned  Your blood work was OK today  /You will be contacted regarding the referral for: Dr Tamala Julian - sports medicine for the right hipo pain  Please go to the XRAY Department in the Basement (go straight as you get off the elevator) for the x-ray testing  You will be contacted by phone if any changes need to be made immediately.  Otherwise, you will receive a letter about your results with an explanation, but please check with MyChart first.  Please remember to sign up for MyChart if you have not done so, as this will be important to you in the future with finding out test results, communicating by private email, and scheduling acute appointments online when needed.  Please return in 6 months, or sooner if needed

## 2014-07-26 NOTE — Progress Notes (Signed)
Pre visit review using our clinic review tool, if applicable. No additional management support is needed unless otherwise documented below in the visit note. 

## 2014-07-27 ENCOUNTER — Encounter: Payer: Self-pay | Admitting: Internal Medicine

## 2014-08-15 ENCOUNTER — Other Ambulatory Visit: Payer: Self-pay | Admitting: Internal Medicine

## 2014-09-05 ENCOUNTER — Encounter: Payer: Self-pay | Admitting: Family Medicine

## 2014-09-05 ENCOUNTER — Ambulatory Visit (INDEPENDENT_AMBULATORY_CARE_PROVIDER_SITE_OTHER): Payer: BLUE CROSS/BLUE SHIELD | Admitting: Family Medicine

## 2014-09-05 VITALS — BP 130/74 | HR 65 | Ht 69.5 in | Wt 234.0 lb

## 2014-09-05 DIAGNOSIS — M7061 Trochanteric bursitis, right hip: Secondary | ICD-10-CM | POA: Diagnosis not present

## 2014-09-05 NOTE — Progress Notes (Signed)
[name] [place], [address] [address] Phone: [phone] Subjective:    I'm seeing this patient by the request  of:  Cathlean Cower, MD   CC: Right hip pain  [id number] Luis Myers is a 69 y.o. male coming in with complaint of right hip pain. Patient states that he was having pain on this side. Patient has started to increase slowly. Patient states that he is improved but is not having as much pain since he started taking oral anti-inflammatories. Patient though would like to avoid this medicine and would like a diagnosis. Patient states he was having months of intermittent pain on the lateral aspect of his hip that sometimes seem to radiate into his thigh. Denies any back pain that seem to be associated with it. Sometimes can wake him up at night if he rolls onto that side. Patient rated the severity of 7 out of 10 but now is 2 out of 10. Denies any weakness or numbness of the lower extremity.    patient had x-rays taken 07/26/2014 that showed no significant bony abnormality. Minimal arthritis.  Past medical history, social, surgical and family history all reviewed in electronic medical record.  Past Medical History  Diagnosis Date  . Acute gouty arthropathy 11/04/2008  . ALLERGIC RHINITIS 10/07/2007  . ANXIETY 03/04/2007  . BENIGN PROSTATIC HYPERTROPHY 03/04/2007  . CONJUNCTIVITIS, ACUTE, LEFT 11/27/2007  . GLUCOSE INTOLERANCE 04/23/2007  . GOUT 03/04/2007  . HYPERLIPIDEMIA 03/04/2007  . HYPERSOMNIA 02/20/2010  . HYPERTENSION 03/04/2007  . HYPOGONADISM 06/20/2010  . HYPOTHYROIDISM 03/04/2007  . Impaired fasting glucose 04/16/2007  . Morbid obesity 03/04/2007  . PERIPHERAL EDEMA 11/04/2008  . Pure hypercholesterolemia 10/17/2008  . RASH-NONVESICULAR 07/25/2009  . SINUSITIS- ACUTE-NOS 05/16/2008  . URI 11/27/2007  . VITAMIN D DEFICIENCY 06/20/2010  . Impaired glucose tolerance 01/01/2011   Past Surgical History  Procedure Laterality Date  . Ureter  surgery  1978    Stretched   History   Social History  . Marital Status: Married    Spouse Name: N/A  . Number of Children: 1  . Years of Education: N/A   Occupational History  . cust. service    Social History Main Topics  . Smoking status: Never Smoker   . Smokeless tobacco: Never Used  . Alcohol Use: No  . Drug Use: No  . Sexual Activity: Not on file   Other Topics Concern  . Not on file   Social History Narrative   Family History  Problem Relation Age of Onset  . Lung cancer Father   . Heart disease Father   . Heart disease Brother   . Diabetes Other      Review of Systems: No headache, visual changes, nausea, vomiting, diarrhea, constipation, dizziness, abdominal pain, skin rash, fevers, chills, night sweats, weight loss, swollen lymph nodes, body aches, joint swelling, muscle aches, chest pain, shortness of breath, mood changes.   Objective Blood pressure 130/74, pulse 65, height 5' 9.5" (1.765 m), weight 234 lb (106.142 kg), SpO2 96 %.  General: No apparent distress alert and oriented x3 mood and affect normal, dressed appropriately.  HEENT: Pupils equal, extraocular movements intact  Respiratory: Patient's speak in full sentences and does not appear short of breath  Cardiovascular: No lower extremity edema, non tender, no erythema  Skin: Warm dry intact with no signs of infection or rash on extremities or on axial skeleton.  Abdomen: Soft nontender  Neuro: Cranial nerves II through XII are intact,  neurovascularly intact in all extremities with 2+ DTRs and 2+ pulses.  Lymph: No lymphadenopathy of posterior or anterior cervical chain or axillae bilaterally.  Gait normal with good balance and coordination.  MSK:  Non tender with full range of motion and good stability and symmetric strength and tone of shoulders, elbows, wrist, , knee and ankles bilaterally.  Hip: Right ROM IR: 25 Deg, ER: 35 Deg, Flexion: 100 Deg, Extension: 80 Deg, Abduction: 35 Deg,  Adduction: 35 Deg Strength IR: 5/5, ER: 5/5, Flexion: 5/5, Extension: 5/5, Abduction: 5/5, Adduction: 5/5 Pelvic alignment unremarkable to inspection and palpation. Standing hip rotation and gait without trendelenburg sign / unsteadiness. Greater trochanter minimal tenderness over the right side. No tenderness over piriformis and greater trochanter. Mild positive Faber No SI joint tenderness and normal minimal SI movement. Contralateral hip unremarkable.   Impression and Recommendations:     This case required medical decision making of moderate complexity.

## 2014-09-05 NOTE — Progress Notes (Signed)
Pre visit review using our clinic review tool, if applicable. No additional management support is needed unless otherwise documented below in the visit note. 

## 2014-09-05 NOTE — Assessment & Plan Note (Signed)
Patient was already doing better with conservative therapy. We discussed topical anti-inflammatories, over-the-counter medications, icing protocol and home exercises. Patient was given a handout of home exercises that I think will be helpful. Patient is going to try to make these changes and will follow-up in 3 weeks. The patient has any worsening symptoms he can call earlier and we will consider an injection of needed. Patient otherwise I think will do well with conservative therapy.

## 2014-09-05 NOTE — Patient Instructions (Signed)
Good to see you.  Ice 20 minutes 2 times daily. Usually after activity and before bed. Exercises 3 times a week.  Exercises on wall.  Heel and butt touching.  Raise leg 6 inches and hold 2 seconds.  Down slow for count of 4 seconds.  1 set of 30 reps daily on both sides.  Pennsaid twice daily as needed  Turmeric 500mg  1-2 times daily can help with inflammation.  See me again in 3 weeks if not perfect

## 2014-09-26 ENCOUNTER — Ambulatory Visit: Payer: BLUE CROSS/BLUE SHIELD | Admitting: Family Medicine

## 2014-10-03 ENCOUNTER — Other Ambulatory Visit: Payer: Self-pay | Admitting: General Practice

## 2014-10-03 ENCOUNTER — Other Ambulatory Visit: Payer: Self-pay | Admitting: Internal Medicine

## 2014-10-03 MED ORDER — LISINOPRIL 40 MG PO TABS: 40.0000 mg | ORAL_TABLET | Freq: Every day | ORAL | Status: DC

## 2014-10-03 MED ORDER — LEVOTHYROXINE SODIUM 100 MCG PO TABS: 100.0000 ug | ORAL_TABLET | Freq: Every day | ORAL | Status: DC

## 2014-10-03 MED ORDER — ROSUVASTATIN CALCIUM 10 MG PO TABS: ORAL_TABLET | ORAL | Status: DC

## 2014-10-06 ENCOUNTER — Other Ambulatory Visit: Payer: Self-pay | Admitting: *Deleted

## 2014-10-06 MED ORDER — FINASTERIDE 5 MG PO TABS: ORAL_TABLET | ORAL | Status: DC

## 2014-12-19 ENCOUNTER — Other Ambulatory Visit: Payer: Self-pay | Admitting: Internal Medicine

## 2015-03-03 ENCOUNTER — Ambulatory Visit (INDEPENDENT_AMBULATORY_CARE_PROVIDER_SITE_OTHER): Payer: BLUE CROSS/BLUE SHIELD | Admitting: Internal Medicine

## 2015-03-03 ENCOUNTER — Encounter: Payer: Self-pay | Admitting: Internal Medicine

## 2015-03-03 VITALS — BP 114/66 | HR 103 | Temp 99.7°F | Resp 18 | Wt 232.0 lb

## 2015-03-03 DIAGNOSIS — R000 Tachycardia, unspecified: Secondary | ICD-10-CM | POA: Diagnosis not present

## 2015-03-03 DIAGNOSIS — J029 Acute pharyngitis, unspecified: Secondary | ICD-10-CM | POA: Diagnosis not present

## 2015-03-03 DIAGNOSIS — R509 Fever, unspecified: Secondary | ICD-10-CM

## 2015-03-03 MED ORDER — AMOXICILLIN 500 MG PO CAPS: 500.0000 mg | ORAL_CAPSULE | Freq: Three times a day (TID) | ORAL | Status: DC

## 2015-03-03 NOTE — Progress Notes (Signed)
Pre visit review using our clinic review tool, if applicable. No additional management support is needed unless otherwise documented below in the visit note. 

## 2015-03-03 NOTE — Progress Notes (Signed)
   Subjective:    Patient ID: Luis Myers, male    DOB: 1946/01/05, 69 y.o.   MRN: [id number]  HPI His sore throat began 03/02/15 with pain up to level IV on a 10 scale. His temp was up to 101 last night. He took Tylenol for the fever last night and this morning. He's had some white/clear nasal secretions. He has bilateral pressure/ pain in the ears. He also notices slight hoarseness with voice change.   Review of Systems He denies any associated cough or sputum production. He has no frontal sinus or facial pain. There's been no nasal purulence.    Objective:   Physical Exam  He has marked erythema of the nasal mucosa. Tonsils are 1.5+ enlarged without definite exudate. They are severely erythematous. He has a resting tachycardia with accentuated S1. There is dullness in the left upper quadrant without splenomegaly.  General appearance:Adequately nourished; no acute distress or increased work of breathing is present.    Lymphatic: No  lymphadenopathy about the head, neck, or axilla .  Eyes: No conjunctival inflammation or lid edema is present. There is no scleral icterus.  Ears:  External ear exam shows no significant lesions or deformities.  Otoscopic examination reveals clear canals, tympanic membranes are intact bilaterally without bulging, retraction, inflammation or discharge.  Nose:  External nasal examination shows no deformity or inflammation. No septal dislocation or deviation.No obstruction to airflow.   Oral exam: Dental hygiene is good; lips and gums are healthy appearing..  Neck:  No deformities, thyromegaly, masses, or tenderness noted.   Supple with full range of motion without pain.   Heart:  regular rhythm. S1 and S2 normal without gallop, murmur, click, rub or other extra sounds.   Lungs:Chest clear to auscultation; no wheezes, rhonchi,rales ,or rubs present.  Extremities:  No cyanosis, edema, or clubbing  noted    Skin: Warm & dry w/o tenting or jaundice. No  significant lesions or rash.      Assessment & Plan:  #1 severe pharyngitis with 2 positive Centor criteria  See orders

## 2015-03-03 NOTE — Patient Instructions (Signed)
NSAIDS ( Aleve, Advil, Naproxen) or Tylenol every 4 hrs as needed for fever as discussed based on label recommendations Zicam Melts or Zinc lozenges as per package label for sore throat . Complementary options include  vitamin C 2000 mg daily; & Echinacea for 4-7 days. Report persistent or progressive fever; discolored nasal or chest secretions; or frontal headache or facial  pain.      Plain Mucinex (NOT D) for thick secretions ;force NON dairy fluids .   Nasal cleansing in the shower as discussed with lather of mild shampoo.After 10 seconds wash off lather while  exhaling through nostrils. Make sure that all residual soap is removed to prevent irritation.  Flonase OR Nasacort AQ 1 spray in each nostril twice a day as needed. Use the "crossover" technique into opposite nostril spraying toward opposite ear @ 45 degree angle, not straight up into nostril.  Plain Allegra (NOT D )  160 daily , Loratidine 10 mg , OR Zyrtec 10 mg @ bedtime  as needed for itchy eyes & sneezing.

## 2015-07-28 ENCOUNTER — Encounter: Payer: BLUE CROSS/BLUE SHIELD | Admitting: Internal Medicine

## 2015-08-31 ENCOUNTER — Ambulatory Visit (INDEPENDENT_AMBULATORY_CARE_PROVIDER_SITE_OTHER): Payer: BLUE CROSS/BLUE SHIELD | Admitting: Internal Medicine

## 2015-08-31 ENCOUNTER — Encounter: Payer: Self-pay | Admitting: Internal Medicine

## 2015-08-31 VITALS — BP 140/82 | HR 69 | Temp 97.6°F | Resp 20 | Wt 237.0 lb

## 2015-08-31 DIAGNOSIS — E785 Hyperlipidemia, unspecified: Secondary | ICD-10-CM | POA: Diagnosis not present

## 2015-08-31 DIAGNOSIS — Z0000 Encounter for general adult medical examination without abnormal findings: Secondary | ICD-10-CM

## 2015-08-31 DIAGNOSIS — Z1159 Encounter for screening for other viral diseases: Secondary | ICD-10-CM

## 2015-08-31 DIAGNOSIS — R7302 Impaired glucose tolerance (oral): Secondary | ICD-10-CM

## 2015-08-31 DIAGNOSIS — I6523 Occlusion and stenosis of bilateral carotid arteries: Secondary | ICD-10-CM | POA: Insufficient documentation

## 2015-08-31 DIAGNOSIS — I10 Essential (primary) hypertension: Secondary | ICD-10-CM | POA: Diagnosis not present

## 2015-08-31 MED ORDER — LEVOTHYROXINE SODIUM 100 MCG PO TABS: 100.0000 ug | ORAL_TABLET | Freq: Every day | ORAL | Status: DC

## 2015-08-31 MED ORDER — INDOMETHACIN 50 MG PO CAPS: 50.0000 mg | ORAL_CAPSULE | Freq: Three times a day (TID) | ORAL | Status: DC | PRN

## 2015-08-31 MED ORDER — LISINOPRIL 40 MG PO TABS: 40.0000 mg | ORAL_TABLET | Freq: Every day | ORAL | Status: DC

## 2015-08-31 MED ORDER — TAMSULOSIN HCL 0.4 MG PO CAPS
0.4000 mg | ORAL_CAPSULE | Freq: Every day | ORAL | Status: DC
Start: 2015-08-31 — End: 2016-09-10

## 2015-08-31 MED ORDER — COLCHICINE 0.6 MG PO TABS: 0.6000 mg | ORAL_TABLET | Freq: Two times a day (BID) | ORAL | Status: DC

## 2015-08-31 MED ORDER — FINASTERIDE 5 MG PO TABS: ORAL_TABLET | ORAL | Status: DC

## 2015-08-31 MED ORDER — ROSUVASTATIN CALCIUM 10 MG PO TABS
ORAL_TABLET | ORAL | Status: DC
Start: 2015-08-31 — End: 2015-11-30

## 2015-08-31 NOTE — Assessment & Plan Note (Signed)

## 2015-08-31 NOTE — Progress Notes (Signed)
Subjective:    Patient ID: Luis Myers, male    DOB: 02-08-1946, 70 y.o.   MRN: [id number]  HPI Here for wellness and f/u;  Overall doing ok;  Pt denies Chest pain, worsening SOB, DOE, wheezing, orthopnea, PND, worsening LE edema, palpitations, dizziness or syncope.  Pt denies neurological change such as new headache, facial or extremity weakness.  Pt denies polydipsia, polyuria, or low sugar symptoms. Pt states overall good compliance with treatment and medications, good tolerability, and has been trying to follow appropriate diet.  Pt denies worsening depressive symptoms, suicidal ideation or panic. No fever, night sweats, wt loss, loss of appetite, or other constitutional symptoms.  Pt states good ability with ADL's, has low fall risk, home safety reviewed and adequate, no other significant changes in hearing or vision, and only occasionally active with exercise. 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  Had labs done at work this year as this was more convenient. Mentions on xray films per dental at time of wisdom teeth removal last mo, was noted carotid calcifications - asks for  Past Medical History  Diagnosis Date  . Acute gouty arthropathy 11/04/2008  . ALLERGIC RHINITIS 10/07/2007  . ANXIETY 03/04/2007  . BENIGN PROSTATIC HYPERTROPHY 03/04/2007  . CONJUNCTIVITIS, ACUTE, LEFT 11/27/2007  . GLUCOSE INTOLERANCE 04/23/2007  . GOUT 03/04/2007  . HYPERLIPIDEMIA 03/04/2007  . HYPERSOMNIA 02/20/2010  . HYPERTENSION 03/04/2007  . HYPOGONADISM 06/20/2010  . HYPOTHYROIDISM 03/04/2007  . Impaired fasting glucose 04/16/2007  . Morbid obesity ([place]) 03/04/2007  . PERIPHERAL EDEMA 11/04/2008  . Pure hypercholesterolemia 10/17/2008  . RASH-NONVESICULAR 07/25/2009  . SINUSITIS- ACUTE-NOS 05/16/2008  . URI 11/27/2007  . VITAMIN D DEFICIENCY 06/20/2010  . Impaired glucose tolerance 01/01/2011   Past Surgical History    Procedure Laterality Date  . Ureter surgery  1978    Stretched    reports that he has never smoked. He has never used smokeless tobacco. He reports that he does not drink alcohol or use illicit drugs. family history includes Diabetes in his other; Heart disease in his brother and father; Lung cancer in his father. Allergies  Allergen Reactions  . Atorvastatin Other (See Comments)    Muscle pain  . Niacin Other (See Comments)    Tachycardia rate 160 beats / minute  . Simvastatin Other (See Comments)    Muscle pain  . Allopurinol Rash   Current Outpatient Prescriptions on File Prior to Visit  Medication Sig Dispense Refill  . aspirin 81 MG tablet Take 81 mg by mouth daily.      . Cholecalciferol (VITAMIN D) 2000 UNITS CAPS Take by mouth daily.      Marland Kitchen COLCRYS 0.6 MG tablet TAKE 1 BY MOUTH TWICE DAILY 180 tablet 1  . finasteride (PROSCAR) 5 MG tablet TAKE 1 BY MOUTH DAILY 90 tablet 3  . indomethacin (INDOCIN) 50 MG capsule Take 50 mg by mouth 3 (three) times daily as needed.     Marland Kitchen levothyroxine (SYNTHROID, LEVOTHROID) 100 MCG tablet Take 1 tablet (100 mcg total) by mouth daily. 90 tablet 3  . lisinopril (PRINIVIL,ZESTRIL) 40 MG tablet Take 1 tablet (40 mg total) by mouth daily. 90 tablet 3  . rosuvastatin (CRESTOR) 10 MG tablet TAKE 1 BY MOUTH DAILY 90 tablet 3  . Tamsulosin HCl (FLOMAX) 0.4 MG CAPS Take 1 capsule (0.4 mg total) by mouth daily. 90 capsule 3   No current  facility-administered medications on file prior to visit.    Review of Systems Constitutional: Negative for increased diaphoresis, other activity, appetite or siginficant weight change other than noted HENT: Negative for worsening hearing loss, ear pain, facial swelling, mouth sores and neck stiffness.   Eyes: Negative for other worsening pain, redness or visual disturbance.  Respiratory: Negative for shortness of breath and wheezing  Cardiovascular: Negative for chest pain and palpitations.  Gastrointestinal:  Negative for diarrhea, blood in stool, abdominal distention or other pain Genitourinary: Negative for hematuria, flank pain or change in urine volume.  Musculoskeletal: Negative for myalgias or other joint complaints.  Skin: Negative for color change and wound or drainage.  Neurological: Negative for syncope and numbness. other than noted Hematological: Negative for adenopathy. or other swelling Psychiatric/Behavioral: Negative for hallucinations, SI, self-injury, decreased concentration or other worsening agitation.      Objective:   Physical Exam BP 140/82 mmHg  Pulse 69  Temp(Src) 97.6 F (36.4 C) (Oral)  Resp 20  Wt 237 lb (107.502 kg)  SpO2 97% VS noted,  Constitutional: Pt is oriented to person, place, and time. Appears well-developed and well-nourished, in no significant distress Head: Normocephalic and atraumatic.  Right Ear: External ear normal.  Left Ear: External ear normal.  Nose: Nose normal.  Mouth/Throat: Oropharynx is clear and moist.  Eyes: Conjunctivae and EOM are normal. Pupils are equal, round, and reactive to light.  Neck: Normal range of motion. Neck supple. No JVD present. No tracheal deviation present or significant neck LA or mass Cardiovascular: Normal rate, regular rhythm, normal heart sounds and intact distal pulses.   Pulmonary/Chest: Effort normal and breath sounds without rales or wheezing  Abdominal: Soft. Bowel sounds are normal. NT. No HSM  Musculoskeletal: Normal range of motion. Exhibits no edema.  Lymphadenopathy:  Has no cervical adenopathy.  Neurological: Pt is alert and oriented to person, place, and time. Pt has normal reflexes. No cranial nerve deficit. Motor grossly intact Skin: Skin is warm and dry. No rash noted.  Psychiatric:  Has normal mood and affect. Behavior is normal.      Assessment & Plan:

## 2015-08-31 NOTE — Progress Notes (Signed)
Pre visit review using our clinic review tool, if applicable. No additional management support is needed unless otherwise documented below in the visit note. 

## 2015-08-31 NOTE — Assessment & Plan Note (Signed)
stable overall by history and exam, recent data reviewed with pt, and pt to continue medical treatment as before,  to f/u any worsening symptoms or concerns BP Readings from Last 3 Encounters:  08/31/15 140/82  03/03/15 114/66  09/05/14 130/74

## 2015-08-31 NOTE — Patient Instructions (Addendum)
Your EKG was OK today  You will be contacted regarding the referral for: carotid ultrasound  Please continue all other medications as before, and refills have been done if requested.  Please have the pharmacy call with any other refills you may need.  Please continue your efforts at being more active, low cholesterol diet, and weight control.  You are otherwise up to date with prevention measures today.  Please keep your appointments with your specialists as you may have planned  Please return in 1 year for your yearly visit, or sooner if needed, with Lab testing done 3-5 days before

## 2015-08-31 NOTE — Assessment & Plan Note (Signed)
stable overall by history and exam, recent data reviewed with pt, and pt to continue medical treatment as before,  to f/u any worsening symptoms or concerns Lab Results  Component Value Date   LDLCALC 71 07/05/2013

## 2015-08-31 NOTE — Assessment & Plan Note (Signed)
stable overall by history and exam, recent data reviewed with pt, and pt to continue medical treatment as before,  to f/u any worsening symptoms or concerns Lab Results  Component Value Date   HGBA1C 6.0 07/19/2014

## 2015-08-31 NOTE — Assessment & Plan Note (Signed)
Asympt, cont asa, for carotid u/s to be done, r/o stenosis

## 2015-09-19 ENCOUNTER — Ambulatory Visit (HOSPITAL_COMMUNITY): Payer: BLUE CROSS/BLUE SHIELD

## 2015-10-16 ENCOUNTER — Other Ambulatory Visit: Payer: Self-pay | Admitting: Internal Medicine

## 2015-10-16 ENCOUNTER — Ambulatory Visit (HOSPITAL_COMMUNITY)
Admission: RE | Admit: 2015-10-16 | Discharge: 2015-10-16 | Disposition: A | Discharge status: Home / Self Care | Payer: BLUE CROSS/BLUE SHIELD | Source: Ambulatory Visit | Attending: Internal Medicine | Admitting: Internal Medicine

## 2015-10-16 DIAGNOSIS — I6523 Occlusion and stenosis of bilateral carotid arteries: Secondary | ICD-10-CM | POA: Diagnosis not present

## 2015-10-16 NOTE — Progress Notes (Signed)
VASCULAR LAB PRELIMINARY  PRELIMINARY  PRELIMINARY  PRELIMINARY  Carotid duplex completed.    Left side:  1-39% ICA stenosis.No evidence of stenosis on the right side  Bilateral: Vertebral artery flow is antegrade.      Janifer Adie, RVT, RDMS 10/16/2015, 4:19 PM

## 2015-10-23 ENCOUNTER — Other Ambulatory Visit: Payer: Self-pay | Admitting: Internal Medicine

## 2015-11-14 DIAGNOSIS — H52223 Regular astigmatism, bilateral: Secondary | ICD-10-CM | POA: Diagnosis not present

## 2015-11-14 DIAGNOSIS — H5203 Hypermetropia, bilateral: Secondary | ICD-10-CM | POA: Diagnosis not present

## 2015-11-14 DIAGNOSIS — H524 Presbyopia: Secondary | ICD-10-CM | POA: Diagnosis not present

## 2015-11-30 ENCOUNTER — Other Ambulatory Visit: Payer: Self-pay | Admitting: Internal Medicine

## 2015-12-08 ENCOUNTER — Telehealth: Payer: Self-pay

## 2015-12-08 MED ORDER — ROSUVASTATIN CALCIUM 10 MG PO TABS: ORAL_TABLET | ORAL | Status: DC

## 2015-12-08 NOTE — Telephone Encounter (Signed)
Medication faxed to pharmacy, patient aware of results

## 2015-12-08 NOTE — Telephone Encounter (Signed)
Medication faxed to pharmacy 

## 2015-12-08 NOTE — Telephone Encounter (Signed)
Summary: Findings consistent with 1- 39 percent stenosis involving the left internal carotid artery.  Other specific details can be found in the table(s) above. Prepared and Electronically Authenticated by  Jessy Oto. Fields MD 2017-05-08T16:34:45    The Carotid testing did not show worsening or significant blockages; no need for further evaluation at this time

## 2015-12-08 NOTE — Telephone Encounter (Signed)
Patient is asking if there are any results from an ultrasound from may 2017

## 2015-12-08 NOTE — Telephone Encounter (Signed)
Medication refill faxed to pharmacy

## 2015-12-08 NOTE — Telephone Encounter (Signed)
Left message for patient to give me a call back.

## 2015-12-08 NOTE — Telephone Encounter (Signed)
rosuvastatin (CRESTOR) 10 MG tablet Patient states that the pharmacy did not get this request. Can you please send it again.  Also patient says he had an ultrasound back in march and never heard anything about the results. Can you please follow up with him. Thank you.

## 2015-12-08 NOTE — Telephone Encounter (Signed)
Patient is requesting results from ultrasound done is may 2017

## 2016-01-23 ENCOUNTER — Other Ambulatory Visit: Payer: Self-pay | Admitting: *Deleted

## 2016-01-23 MED ORDER — ROSUVASTATIN CALCIUM 10 MG PO TABS: ORAL_TABLET | ORAL | 2 refills | Status: DC

## 2016-01-23 MED ORDER — LEVOTHYROXINE SODIUM 100 MCG PO TABS: 100.0000 ug | ORAL_TABLET | Freq: Every day | ORAL | 2 refills | Status: DC

## 2016-01-24 ENCOUNTER — Other Ambulatory Visit: Payer: Self-pay | Admitting: Internal Medicine

## 2016-01-29 ENCOUNTER — Other Ambulatory Visit: Payer: Self-pay | Admitting: *Deleted

## 2016-01-29 ENCOUNTER — Telehealth: Payer: Self-pay | Admitting: Internal Medicine

## 2016-01-29 MED ORDER — LEVOTHYROXINE SODIUM 100 MCG PO TABS: 100.0000 ug | ORAL_TABLET | Freq: Every day | ORAL | 2 refills | Status: DC

## 2016-01-29 NOTE — Telephone Encounter (Signed)
Patient states he had ultra sound 4 months ago but has not heard results yet.

## 2016-01-29 NOTE — Telephone Encounter (Signed)
Left msg on triage stating [facility] states they have sent request to have synthroid refilled, and they haven't received response back. Called pt inform per chart refill was sent to them on 8/15, will resend electronically to [facility]...Luis Myers

## 2016-01-30 NOTE — Telephone Encounter (Signed)
Summary: Findings consistent with 1- 39 percent stenosis involving the left internal carotid artery.  Other specific details can be found in the table(s) above. Prepared and Electronically Authenticated by  Jessy Oto. Fields MD 2017-05-08T16:34:45  Results are as above, this is not hemodynamically significant and does not require any surgical intervention at this time.Ok to cont same tx, including low chol diet, and I suspect you may be called in 1-2 yrs for f/u exam

## 2016-01-31 NOTE — Telephone Encounter (Signed)
Called patient, unable to reach, left message for patient to give [place] a call back regarding his test results.

## 2016-02-26 NOTE — Telephone Encounter (Signed)
Patient called to advise that he still has not heard anything regarding this scan almost 4 months ago. I advised that corrine noted that she tried to contact him. I verified all #'s on file to be correct with active vm's on all of them. He states that he never heard anything.   I read the notes from dr Jenny Reichmann in the absence of corrine. He understood.

## 2016-03-20 IMAGING — CR DG HIP (WITH OR WITHOUT PELVIS) 3-4V BILAT
5 series · 5 of 5 positions shown · non-contrast
Comparison: None.

CLINICAL DATA: Chronic right hip pain.

EXAM:
BILATERAL HIP (WITH PELVIS) 3-4 VIEWS

[view not recorded (1 of 5)]
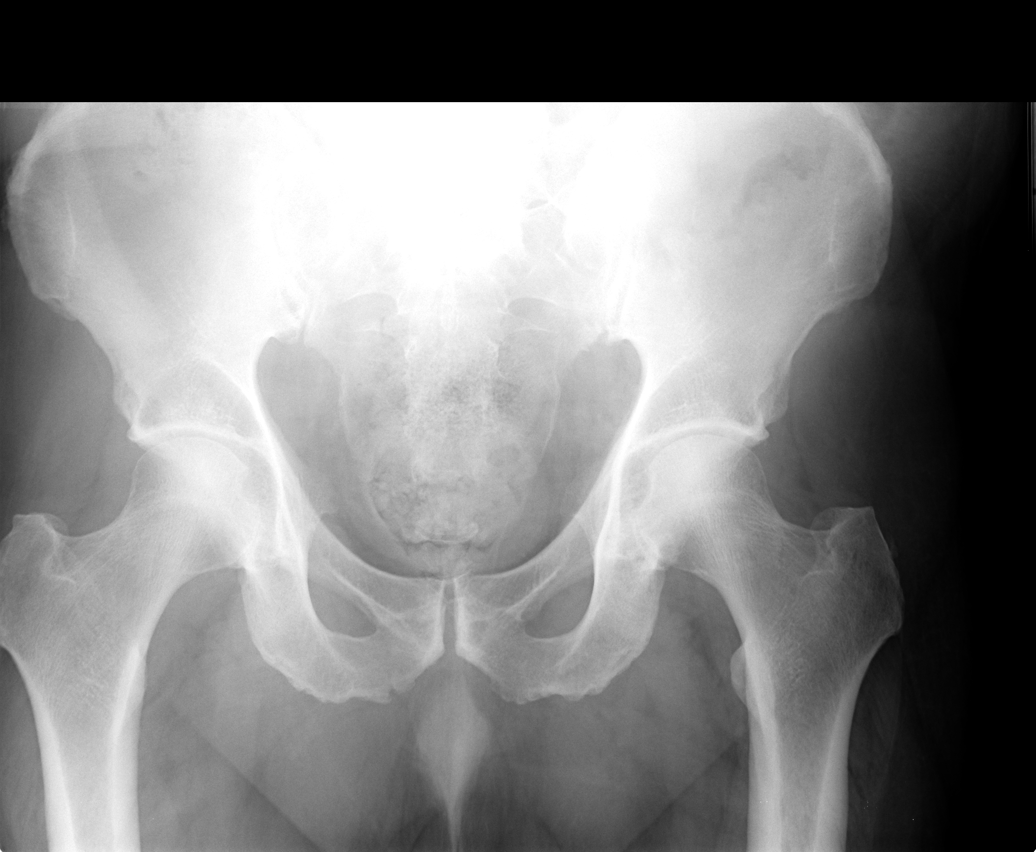

[view not recorded (2 of 5)]
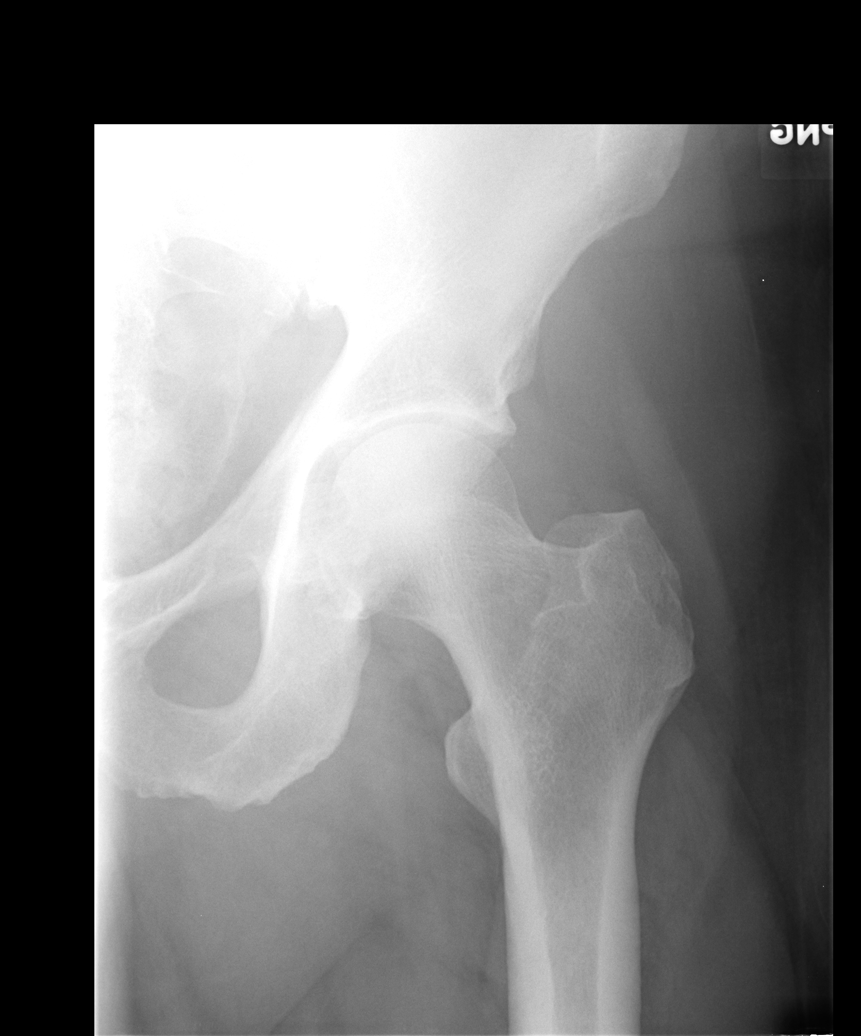

[view not recorded (3 of 5)]
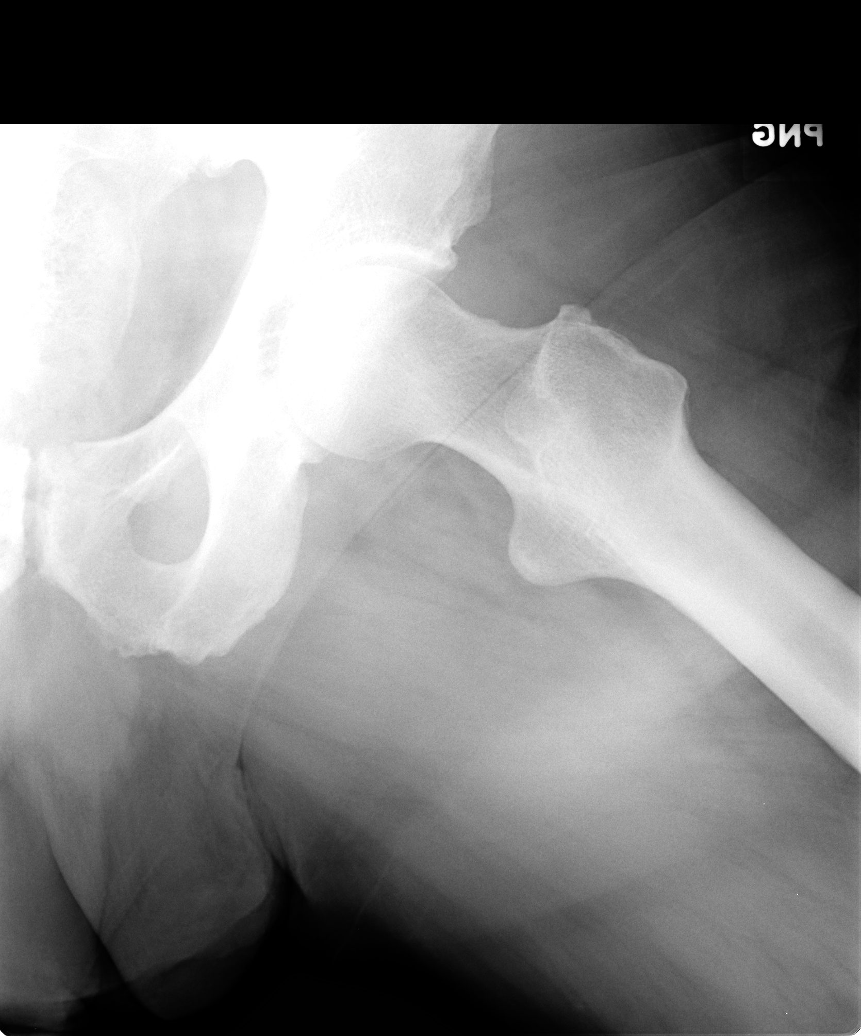

[view not recorded (4 of 5)]
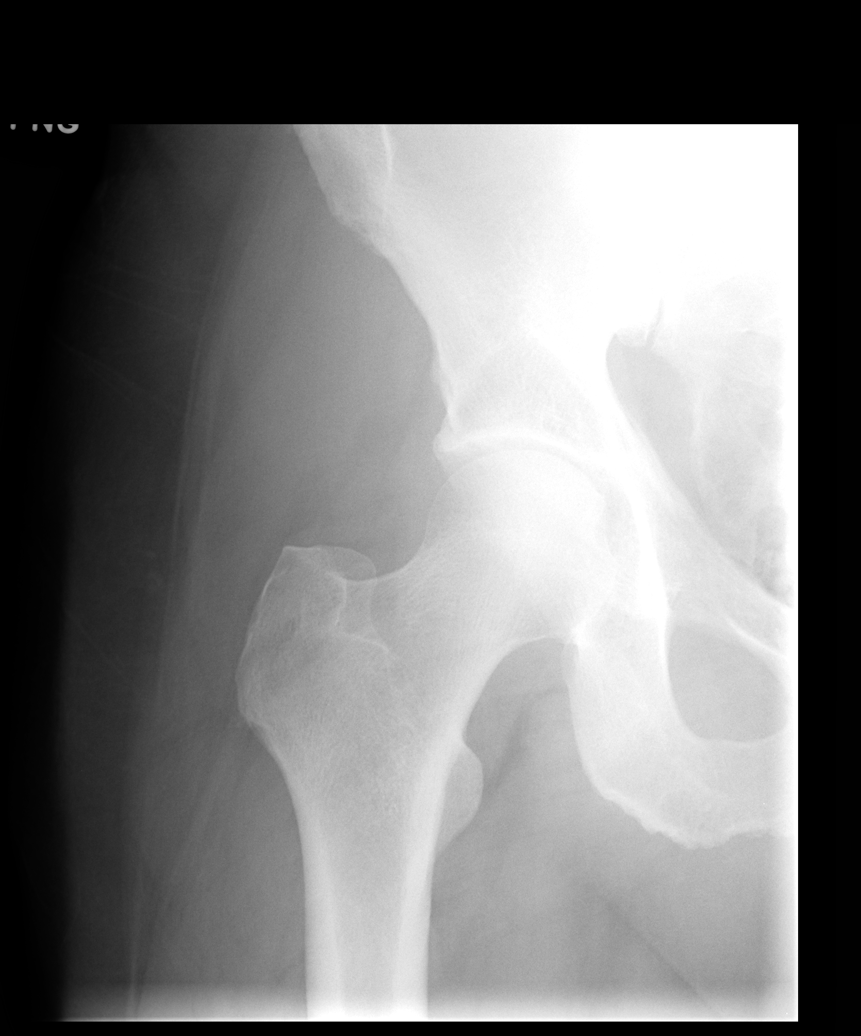

[view not recorded (5 of 5)]
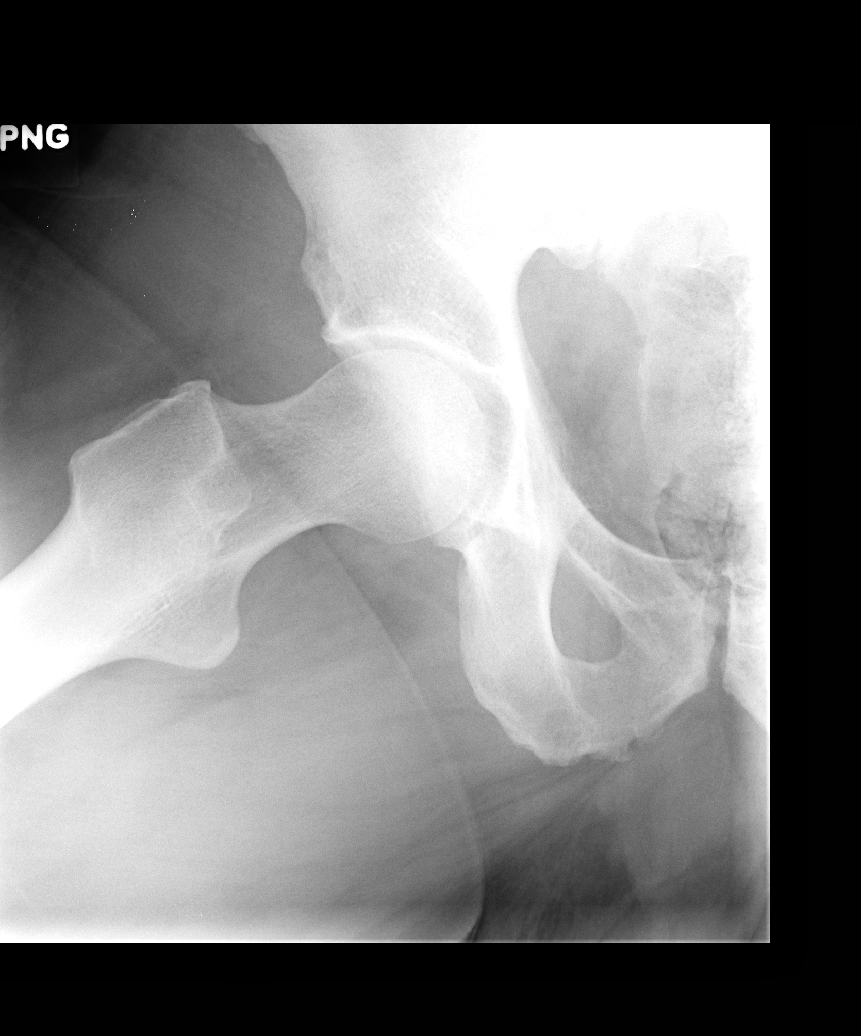

[5 of 5 positions shown; findings below may reference images not displayed]

FINDINGS: No acute bony or joint abnormality. No evidence of fracture or
dislocation. Mild degenerative changes both hips.
IMPRESSION: No acute abnormality.

## 2016-07-11 ENCOUNTER — Other Ambulatory Visit: Payer: Self-pay | Admitting: *Deleted

## 2016-07-11 MED ORDER — FINASTERIDE 5 MG PO TABS: ORAL_TABLET | ORAL | 0 refills | Status: DC

## 2016-07-29 ENCOUNTER — Other Ambulatory Visit: Payer: Self-pay | Admitting: *Deleted

## 2016-07-29 MED ORDER — COLCHICINE 0.6 MG PO TABS: 0.6000 mg | ORAL_TABLET | Freq: Two times a day (BID) | ORAL | 0 refills | Status: DC

## 2016-09-02 LAB — LIPID PANEL
Cholesterol: 144 mg/dL (ref 0–200)
HDL: 38 mg/dL (ref 35–70)
LDL Cholesterol: 60 mg/dL
Triglycerides: 230 mg/dL — AB (ref 40–160)

## 2016-09-02 LAB — CBC AND DIFFERENTIAL
HCT: 42 % (ref 41–53)
Hemoglobin: 13.9 g/dL (ref 13.5–17.5)
PLATELETS: 152 10*3/uL (ref 150–399)
WBC: 5 10*3/mL

## 2016-09-02 LAB — VITAMIN D 25 HYDROXY (VIT D DEFICIENCY, FRACTURES): VIT D 25 HYDROXY: 17.4

## 2016-09-02 LAB — BASIC METABOLIC PANEL
BUN: 12 mg/dL (ref 4–21)
GLUCOSE: 101 mg/dL

## 2016-09-02 LAB — TSH: TSH: 6.37 u[IU]/mL — AB (ref <=5.90)

## 2016-09-02 LAB — HEMOGLOBIN A1C: Hemoglobin A1C: 5.9

## 2016-09-10 ENCOUNTER — Encounter: Payer: Self-pay | Admitting: Internal Medicine

## 2016-09-10 ENCOUNTER — Ambulatory Visit (INDEPENDENT_AMBULATORY_CARE_PROVIDER_SITE_OTHER): Payer: BLUE CROSS/BLUE SHIELD | Admitting: Internal Medicine

## 2016-09-10 VITALS — BP 118/72 | HR 65 | Temp 98.2°F | Ht 69.0 in | Wt 240.0 lb

## 2016-09-10 DIAGNOSIS — Z0000 Encounter for general adult medical examination without abnormal findings: Secondary | ICD-10-CM | POA: Diagnosis not present

## 2016-09-10 DIAGNOSIS — E039 Hypothyroidism, unspecified: Secondary | ICD-10-CM | POA: Diagnosis not present

## 2016-09-10 MED ORDER — ROSUVASTATIN CALCIUM 10 MG PO TABS: ORAL_TABLET | ORAL | 3 refills | Status: DC

## 2016-09-10 MED ORDER — COLCHICINE 0.6 MG PO TABS: 0.6000 mg | ORAL_TABLET | Freq: Two times a day (BID) | ORAL | 3 refills | Status: DC

## 2016-09-10 MED ORDER — FINASTERIDE 5 MG PO TABS: ORAL_TABLET | ORAL | 3 refills | Status: DC

## 2016-09-10 MED ORDER — TAMSULOSIN HCL 0.4 MG PO CAPS: 0.4000 mg | ORAL_CAPSULE | Freq: Every day | ORAL | 3 refills | Status: DC

## 2016-09-10 MED ORDER — LISINOPRIL 40 MG PO TABS: 40.0000 mg | ORAL_TABLET | Freq: Every day | ORAL | 3 refills | Status: DC

## 2016-09-10 MED ORDER — LEVOTHYROXINE SODIUM 100 MCG PO TABS: 100.0000 ug | ORAL_TABLET | Freq: Every day | ORAL | 3 refills | Status: DC

## 2016-09-10 MED ORDER — INDOMETHACIN 50 MG PO CAPS: 50.0000 mg | ORAL_CAPSULE | Freq: Three times a day (TID) | ORAL | 2 refills | Status: DC | PRN

## 2016-09-10 NOTE — Patient Instructions (Signed)
Please have your lab work done at [facility] as suggested (see written orders)  Please continue all other medications as before, and refills have been done if requested.  Please have the pharmacy call with any other refills you may need.  Please continue your efforts at being more active, low cholesterol diet, and weight control.  You are otherwise up to date with prevention measures today.  Please keep your appointments with your specialists as you may have planned  Please return in 1 year for your yearly visit, or sooner if needed, with Lab testing done 3-5 days before

## 2016-09-10 NOTE — Progress Notes (Signed)
Subjective:    Patient ID: Luis Myers, male    DOB: 1945/08/26, 71 y.o.   MRN: [id number]  HPI Here for wellness and f/u;  Overall doing ok;  Pt denies Chest pain, worsening SOB, DOE, wheezing, orthopnea, PND, worsening LE edema, palpitations, dizziness or syncope.  Pt denies neurological change such as new headache, facial or extremity weakness.  Pt denies polydipsia, polyuria, or low sugar symptoms. Pt states overall good compliance with treatment and medications, good tolerability, and has been trying to follow appropriate diet.  Pt denies worsening depressive symptoms, suicidal ideation or panic. No fever, night sweats, wt loss, loss of appetite, or other constitutional symptoms.  Pt states good ability with ADL's, has low fall risk, home safety reviewed and adequate, no other significant changes in hearing or vision, and only occasionally active with exercise. Wt Readings from Last 3 Encounters:  09/10/16 240 lb (108.9 kg)  08/31/15 237 lb (107.5 kg)  03/03/15 232 lb (105.2 kg)   BP Readings from Last 3 Encounters:  09/10/16 118/72  08/31/15 140/82  03/03/15 114/66  No other history changes; needs labs done at [facility] Past Medical History:  Diagnosis Date  . Acute gouty arthropathy 11/04/2008  . ALLERGIC RHINITIS 10/07/2007  . ANXIETY 03/04/2007  . BENIGN PROSTATIC HYPERTROPHY 03/04/2007  . CONJUNCTIVITIS, ACUTE, LEFT 11/27/2007  . GLUCOSE INTOLERANCE 04/23/2007  . GOUT 03/04/2007  . HYPERLIPIDEMIA 03/04/2007  . HYPERSOMNIA 02/20/2010  . HYPERTENSION 03/04/2007  . HYPOGONADISM 06/20/2010  . HYPOTHYROIDISM 03/04/2007  . Impaired fasting glucose 04/16/2007  . Impaired glucose tolerance 01/01/2011  . Morbid obesity ([place]) 03/04/2007  . PERIPHERAL EDEMA 11/04/2008  . Pure hypercholesterolemia 10/17/2008  . RASH-NONVESICULAR 07/25/2009  . SINUSITIS- ACUTE-NOS 05/16/2008  . URI 11/27/2007  . VITAMIN D DEFICIENCY 06/20/2010   Past Surgical History:  Procedure Laterality Date  . URETER SURGERY   1978   Stretched    reports that he has never smoked. He has never used smokeless tobacco. He reports that he does not drink alcohol or use drugs. family history includes Diabetes in his other; Heart disease in his brother and father; Lung cancer in his father. Allergies  Allergen Reactions  . Atorvastatin Other (See Comments)    Muscle pain  . Niacin Other (See Comments)    Tachycardia rate 160 beats / minute  . Simvastatin Other (See Comments)    Muscle pain  . Allopurinol Rash   No current outpatient prescriptions on file prior to visit.   No current facility-administered medications on file prior to visit.    Review of Systems Constitutional: Negative for other unusual diaphoresis, sweats, appetite or weight changes HENT: Negative for other worsening hearing loss, ear pain, facial swelling, mouth sores or neck stiffness.   Eyes: Negative for other worsening pain, redness or other visual disturbance.  Respiratory: Negative for other stridor or swelling Cardiovascular: Negative for other palpitations or other chest pain  Gastrointestinal: Negative for worsening diarrhea or loose stools, blood in stool, distention or other pain Genitourinary: Negative for hematuria, flank pain or other change in urine volume.  Musculoskeletal: Negative for myalgias or other joint swelling.  Skin: Negative for other color change, or other wound or worsening drainage.  Neurological: Negative for other syncope or numbness. Hematological: Negative for other adenopathy or swelling Psychiatric/Behavioral: Negative for hallucinations, other worsening agitation, SI, self-injury, or new decreased concentration All other system neg per pt    Objective:   Physical Exam BP 118/72   Pulse 65   Temp 98.2  F (36.8 C) (Oral)   Ht 5\' 9"  (1.753 m)   Wt 240 lb (108.9 kg)   SpO2 95%   BMI 35.44 kg/m  VS noted,  Constitutional: Pt is oriented to person, place, and time. Appears well-developed and  well-nourished, in no significant distress and comfortable Head: Normocephalic and atraumatic  Eyes: Conjunctivae and EOM are normal. Pupils are equal, round, and reactive to light Right Ear: External ear normal without discharge Left Ear: External ear normal without discharge Nose: Nose without discharge or deformity Mouth/Throat: Oropharynx is without other ulcerations and moist  Neck: Normal range of motion. Neck supple. No JVD present. No tracheal deviation present or significant neck LA or mass Cardiovascular: Normal rate, regular rhythm, normal heart sounds and intact distal pulses.   Pulmonary/Chest: WOB normal and breath sounds without rales or wheezing  Abdominal: Soft. Bowel sounds are normal. NT. No HSM  Musculoskeletal: Normal range of motion. Exhibits no edema Lymphadenopathy: Has no other cervical adenopathy.  Neurological: Pt is alert and oriented to person, place, and time. Pt has normal reflexes. No cranial nerve deficit. Motor grossly intact, Gait intact Skin: Skin is warm and dry. No rash noted or new ulcerations Psychiatric:  Has normal mood and affect. Behavior is normal without agitation No other exam findings  ECG today I have personally interpreted Sinus  Rhythm      Assessment & Plan:

## 2016-09-10 NOTE — Progress Notes (Signed)
Pre visit review using our clinic review tool, if applicable. No additional management support is needed unless otherwise documented below in the visit note. 

## 2016-09-16 NOTE — Assessment & Plan Note (Signed)

## 2016-09-16 NOTE — Assessment & Plan Note (Signed)
Lab Results  Component Value Date   TSH 6.37 (A) 09/02/2016   Stable, cont same dose for now

## 2017-02-06 ENCOUNTER — Ambulatory Visit: Payer: Self-pay

## 2017-02-06 ENCOUNTER — Ambulatory Visit (INDEPENDENT_AMBULATORY_CARE_PROVIDER_SITE_OTHER): Payer: BLUE CROSS/BLUE SHIELD | Admitting: Family Medicine

## 2017-02-06 ENCOUNTER — Encounter: Payer: Self-pay | Admitting: Family Medicine

## 2017-02-06 VITALS — BP 118/80 | HR 64 | Ht 69.0 in | Wt 238.0 lb

## 2017-02-06 DIAGNOSIS — M79672 Pain in left foot: Secondary | ICD-10-CM

## 2017-02-06 DIAGNOSIS — S92034A Nondisplaced avulsion fracture of tuberosity of right calcaneus, initial encounter for closed fracture: Secondary | ICD-10-CM

## 2017-02-06 DIAGNOSIS — S92009A Unspecified fracture of unspecified calcaneus, initial encounter for closed fracture: Secondary | ICD-10-CM | POA: Insufficient documentation

## 2017-02-06 MED ORDER — VITAMIN D (ERGOCALCIFEROL) 1.25 MG (50000 UNIT) PO CAPS: 50000.0000 [IU] | ORAL_CAPSULE | ORAL | 0 refills | Status: DC

## 2017-02-06 NOTE — Assessment & Plan Note (Addendum)
Patient does have a fracture. Cam Walker. Patient did well with conservative therapy hopefully. Once weekly vitamin D given. We discussed icing regimen. Patient will come back and see me again in 3 weeks. At that time if improving we will try to transferred into regular shoes again.

## 2017-02-06 NOTE — Patient Instructions (Addendum)
Good to see you   You have a heel spur that is fractured.  Wear boot with walking for next 3 weeks Once weekly vitamin D for 12 weeks.  Ice 10 minutes 1-2 times a day  When sitting please come out of the boot and move the ankle so it does not get stiff See me again in 3 weeks.

## 2017-02-06 NOTE — Progress Notes (Signed)
[name] [address], [place] [address] Phone: [phone] Subjective:    I'm seeing this patient by the request  of:  Biagio Borg, MD   CC: Ankle pain  JOA:[id number]  Luis Myers is a 71 y.o. male coming in with complaint of ankle pain. Been going on off for approximately 2 months. Now worsening. Anesthesia pain and since then has even had difficulty putting weight on it. File was potentially has gout but was not red and swollen. Patient states that now new shoes has been able to at least put weight on it but still very uncomfortable. States that there is even a throbbing sensation at night. Very localized to the plantar aspect of the foot.       Past Medical History:  Diagnosis Date  . Acute gouty arthropathy 11/04/2008  . ALLERGIC RHINITIS 10/07/2007  . ANXIETY 03/04/2007  . BENIGN PROSTATIC HYPERTROPHY 03/04/2007  . CONJUNCTIVITIS, ACUTE, LEFT 11/27/2007  . GLUCOSE INTOLERANCE 04/23/2007  . GOUT 03/04/2007  . HYPERLIPIDEMIA 03/04/2007  . HYPERSOMNIA 02/20/2010  . HYPERTENSION 03/04/2007  . HYPOGONADISM 06/20/2010  . HYPOTHYROIDISM 03/04/2007  . Impaired fasting glucose 04/16/2007  . Impaired glucose tolerance 01/01/2011  . Morbid obesity ([place]) 03/04/2007  . PERIPHERAL EDEMA 11/04/2008  . Pure hypercholesterolemia 10/17/2008  . RASH-NONVESICULAR 07/25/2009  . SINUSITIS- ACUTE-NOS 05/16/2008  . URI 11/27/2007  . VITAMIN D DEFICIENCY 06/20/2010   Past Surgical History:  Procedure Laterality Date  . URETER SURGERY  1978   Stretched   Social History   Social History  . Marital status: Married    Spouse name: N/A  . Number of children: 1  . Years of education: N/A   Occupational History  . cust. service    Social History Main Topics  . Smoking status: Never Smoker  . Smokeless tobacco: Never Used  . Alcohol use No  . Drug use: No  . Sexual activity: Not on file   Other Topics Concern  . Not on file   Social History Narrative   . No narrative on file   Allergies  Allergen Reactions  . Atorvastatin Other (See Comments)    Muscle pain  . Niacin Other (See Comments)    Tachycardia rate 160 beats / minute  . Simvastatin Other (See Comments)    Muscle pain  . Allopurinol Rash   Family History  Problem Relation Age of Onset  . Lung cancer Father   . Heart disease Father   . Heart disease Brother   . Diabetes Other      Past medical history, social, surgical and family history all reviewed in electronic medical record.  No pertanent information unless stated regarding to the chief complaint.   Review of Systems:Review of systems updated and as accurate as of 02/06/17  No headache, visual changes, nausea, vomiting, diarrhea, constipation, dizziness, abdominal pain, skin rash, fevers, chills, night sweats, weight loss, swollen lymph nodes, body aches, joint swelling, chest pain, shortness of breath, mood changes. Positive muscle aches  Objective  There were no vitals taken for this visit. Systems examined below as of 02/06/17   General: No apparent distress alert and oriented x3 mood and affect normal, dressed appropriately.  HEENT: Pupils equal, extraocular movements intact  Respiratory: Patient's speak in full sentences and does not appear short of breath  Cardiovascular: No lower extremity edema, non tender, no erythema  Skin: Warm dry intact with no signs of infection or rash on extremities or on axial  skeleton.  Abdomen: Soft nontender  Neuro: Cranial nerves II through XII are intact, neurovascularly intact in all extremities with 2+ DTRs and 2+ pulses.  Lymph: No lymphadenopathy of posterior or anterior cervical chain or axillae bilaterally.  Gait normal with good balance and coordination.  MSK:  Non tender with full range of motion and good stability and symmetric strength and tone of shoulders, elbows, wrist, hip, knee and ankles bilaterally. Arthritic changes of multiple joints Foot exam shows the  patient does have severe tenderness to palpation on the hindfoot on the plantar aspect. Mild swelling in the area. Neurovascular intact distally. Full range of motion of the ankle. Patient does have significant pes planus with overpronation  Limited musculoskeletal ultrasound was performed and interpreted by Lyndal Pulley  Limited ultrasound shows the patient does have what appears to be a heel spur that does have a fracture. Hypoechoic changes. Minimal callus formation noted. Impression: Heel spur fracture    Impression and Recommendations:     This case required medical decision making of moderate complexity.      Note: This dictation was prepared with Dragon dictation along with smaller phrase technology. Any transcriptional errors that result from this process are unintentional.

## 2017-02-27 ENCOUNTER — Encounter: Payer: Self-pay | Admitting: Family Medicine

## 2017-02-27 ENCOUNTER — Ambulatory Visit: Payer: Self-pay

## 2017-02-27 ENCOUNTER — Ambulatory Visit (INDEPENDENT_AMBULATORY_CARE_PROVIDER_SITE_OTHER): Payer: BLUE CROSS/BLUE SHIELD | Admitting: Family Medicine

## 2017-02-27 VITALS — BP 138/84 | HR 63 | Ht 69.0 in | Wt 242.0 lb

## 2017-02-27 DIAGNOSIS — M79672 Pain in left foot: Principal | ICD-10-CM

## 2017-02-27 DIAGNOSIS — M722 Plantar fascial fibromatosis: Secondary | ICD-10-CM | POA: Diagnosis not present

## 2017-02-27 DIAGNOSIS — G8929 Other chronic pain: Secondary | ICD-10-CM

## 2017-02-27 DIAGNOSIS — S92034G Nondisplaced avulsion fracture of tuberosity of right calcaneus, subsequent encounter for fracture with delayed healing: Secondary | ICD-10-CM

## 2017-02-27 NOTE — Assessment & Plan Note (Signed)
Plantar Fascitis: We reviewed that stretching is critically important to the treatment of PF. Reviewed footwear. Rigid soles have been shown to help with PF. Night splints can help. Reviewed rehab of stretching and calf raises.  Could benefit from a corticosteroid injection, orthotics, or other measures if conservative treatment fails.

## 2017-02-27 NOTE — Assessment & Plan Note (Addendum)
Making some progress. We'll likely take some more time. Continue the once weekly vitamin D. Was unable to tolerate the Cam Walker. Patient does have some plantar fasciitis now as well home exercises given, we discussed icing regimen. Patient started to increase activity as tolerated. Follow-up again in 3 weeks

## 2017-02-27 NOTE — Progress Notes (Signed)
[name] [address], [place] [address] Phone: [phone] Subjective:    I'm seeing this patient by the request  of:    CC: Foot pain follow-up left  DGU:[id number]  Luis Myers is a 71 y.o. male coming in for a follow-up for left heel pain. He was wearing a boot for one week and then discontinued it. He switched to a more supportive shoe which he said helps. He is about 40-50% better with most of his pain still occurring in the morning for approximately 1 hour. Still having more stiffness.      Past Medical History:  Diagnosis Date  . Acute gouty arthropathy 11/04/2008  . ALLERGIC RHINITIS 10/07/2007  . ANXIETY 03/04/2007  . BENIGN PROSTATIC HYPERTROPHY 03/04/2007  . CONJUNCTIVITIS, ACUTE, LEFT 11/27/2007  . GLUCOSE INTOLERANCE 04/23/2007  . GOUT 03/04/2007  . HYPERLIPIDEMIA 03/04/2007  . HYPERSOMNIA 02/20/2010  . HYPERTENSION 03/04/2007  . HYPOGONADISM 06/20/2010  . HYPOTHYROIDISM 03/04/2007  . Impaired fasting glucose 04/16/2007  . Impaired glucose tolerance 01/01/2011  . Morbid obesity ([place]) 03/04/2007  . PERIPHERAL EDEMA 11/04/2008  . Pure hypercholesterolemia 10/17/2008  . RASH-NONVESICULAR 07/25/2009  . SINUSITIS- ACUTE-NOS 05/16/2008  . URI 11/27/2007  . VITAMIN D DEFICIENCY 06/20/2010   Past Surgical History:  Procedure Laterality Date  . URETER SURGERY  1978   Stretched   Social History   Social History  . Marital status: Married    Spouse name: N/A  . Number of children: 1  . Years of education: N/A   Occupational History  . cust. service    Social History Main Topics  . Smoking status: Never Smoker  . Smokeless tobacco: Never Used  . Alcohol use No  . Drug use: No  . Sexual activity: Not Asked   Other Topics Concern  . None   Social History Narrative  . None   Allergies  Allergen Reactions  . Atorvastatin Other (See Comments)    Muscle pain  . Niacin Other (See Comments)    Tachycardia rate 160 beats /  minute  . Simvastatin Other (See Comments)    Muscle pain  . Allopurinol Rash   Family History  Problem Relation Age of Onset  . Lung cancer Father   . Heart disease Father   . Heart disease Brother   . Diabetes Other      Past medical history, social, surgical and family history all reviewed in electronic medical record.  No pertanent information unless stated regarding to the chief complaint.   Review of Systems:Review of systems updated and as accurate as of 02/27/17  No headache, visual changes, nausea, vomiting, diarrhea, constipation, dizziness, abdominal pain, skin rash, fevers, chills, night sweats, weight loss, swollen lymph nodes, body aches, joint swelling, muscle aches, chest pain, shortness of breath, mood changes.   Objective  Blood pressure 138/84, pulse 63, height 5\' 9"  (1.753 m), weight 242 lb (109.8 kg), SpO2 97 %. Systems examined below as of 02/27/17   General: No apparent distress alert and oriented x3 mood and affect normal, dressed appropriately.  HEENT: Pupils equal, extraocular movements intact  Respiratory: Patient's speak in full sentences and does not appear short of breath  Cardiovascular: No lower extremity edema, non tender, no erythema  Skin: Warm dry intact with no signs of infection or rash on extremities or on axial skeleton.  Abdomen: Soft nontender  Neuro: Cranial nerves II through XII are intact, neurovascularly intact in all extremities with 2+ DTRs and 2+  pulses.  Lymph: No lymphadenopathy of posterior or anterior cervical chain or axillae bilaterally.  Gait normal with good balance and coordination.  MSK:  Non tender with full range of motion and good stability and symmetric strength and tone of shoulders, elbows, wrist, hip, knee and ankles bilaterally.  Mild arthritic changes of multiple joints Foot exam shows severe pes planus on the left foot. Still severely tender over the plantar aspect of the medial calcaneal region. Patient has some  mild swelling in the area now as well.  Limited musculoskeletal ultrasound was performed and interpreted by Lyndal Pulley  Limited ultrasound shows the patient does have some mild soft callus formation over the calcaneal fracture that was seen previously. Patient does also have what appears to be a reactive plantar fasciitis now which is new from previous exam. Delayed healing calcaneal fracture with reactive plantar fasciitis  procedure 97110; 15 additional minutes spent for Therapeutic exercises as stated in above notes.  This included exercises focusing on stretching, strengthening, with significant focus on eccentric aspects.   Long term goals include an improvement in range of motion, strength, endurance as well as avoiding reinjury. Patient's frequency would include in 1-2 times a day, 3-5 times a week for a duration of 6-12 weeks. Exercises for the foot include:  Stretches to help lengthen the lower leg and plantar fascia areas Theraband exercises for the lower leg and ankle to help strengthen the surrounding area- dorsiflexion, plantarflexion, inversion, eversion Massage rolling on the plantar surface of the foot with a frozen bottle, tennis ball or golf ball Towel or marble pick-ups to strengthen the plantar surface of the foot Weight bearing exercises to increase balance and overall stability   Proper technique shown and discussed handout in great detail with ATC.  All questions were discussed and answered.     Impression and Recommendations:     This case required medical decision making of moderate complexity.      Note: This dictation was prepared with Dragon dictation along with smaller phrase technology. Any transcriptional errors that result from this process are unintentional.

## 2017-02-27 NOTE — Patient Instructions (Signed)
Good to see you  We are making progress but slow.  The bone looks better Continue the vitamins D pennsaid pinkie amount topically 2 times daily as needed.   We will get you there but will take some time.  Exercises 3 times a week.  OK to bike, elliptical or swim See me againa in 3 weeks and if not better we will consider injectios.

## 2017-03-20 ENCOUNTER — Ambulatory Visit (INDEPENDENT_AMBULATORY_CARE_PROVIDER_SITE_OTHER): Payer: BLUE CROSS/BLUE SHIELD | Admitting: Family Medicine

## 2017-03-20 ENCOUNTER — Encounter: Payer: Self-pay | Admitting: Family Medicine

## 2017-03-20 ENCOUNTER — Ambulatory Visit: Payer: Self-pay

## 2017-03-20 VITALS — BP 122/82 | HR 83 | Ht 69.0 in | Wt 240.0 lb

## 2017-03-20 DIAGNOSIS — M79672 Pain in left foot: Secondary | ICD-10-CM

## 2017-03-20 DIAGNOSIS — M722 Plantar fascial fibromatosis: Secondary | ICD-10-CM | POA: Diagnosis not present

## 2017-03-20 DIAGNOSIS — S92034G Nondisplaced avulsion fracture of tuberosity of right calcaneus, subsequent encounter for fracture with delayed healing: Secondary | ICD-10-CM

## 2017-03-20 NOTE — Assessment & Plan Note (Signed)
Patient has more of a plantar fascial rupture at this time. Discussed proper shoes, over-the-counter orthotics in the possible need for custom orthotics. Discussed rigid soles shoes. Likely will be multiple weeks and then long-term we'll need to support the longitudinal arch. Follow-up again if not completely resolved in 4-6 weeks

## 2017-03-20 NOTE — Assessment & Plan Note (Signed)
Healed at this time. Continue the vitamin D. Follow-up with me as needed.

## 2017-03-20 NOTE — Progress Notes (Signed)
[facility] [name], [place] [address] Phone: [phone] Subjective:    I'm seeing this patient by the request  of:    CC: Heel pain follow-up  [id number]  Luis Myers is a 71 y.o. male coming in for follow up for heel pain. He said that he removed the boot 2 weeks ago and started experiencing pain. Monday and Tuesday of last week he put himself back into the boot and wore if for the remainder of the week. He then transitioned himself into an ankle brace for this week. He said that he is feeling much better. He still notes some discomfort but is overall improving. Patient states that he is feeling 80-90% better.      Past Medical History:  Diagnosis Date  . Acute gouty arthropathy 11/04/2008  . ALLERGIC RHINITIS 10/07/2007  . ANXIETY 03/04/2007  . BENIGN PROSTATIC HYPERTROPHY 03/04/2007  . CONJUNCTIVITIS, ACUTE, LEFT 11/27/2007  . GLUCOSE INTOLERANCE 04/23/2007  . GOUT 03/04/2007  . HYPERLIPIDEMIA 03/04/2007  . HYPERSOMNIA 02/20/2010  . HYPERTENSION 03/04/2007  . HYPOGONADISM 06/20/2010  . HYPOTHYROIDISM 03/04/2007  . Impaired fasting glucose 04/16/2007  . Impaired glucose tolerance 01/01/2011  . Morbid obesity ([place]) 03/04/2007  . PERIPHERAL EDEMA 11/04/2008  . Pure hypercholesterolemia 10/17/2008  . RASH-NONVESICULAR 07/25/2009  . SINUSITIS- ACUTE-NOS 05/16/2008  . URI 11/27/2007  . VITAMIN D DEFICIENCY 06/20/2010   Past Surgical History:  Procedure Laterality Date  . URETER SURGERY  1978   Stretched   Social History   Social History  . Marital status: Married    Spouse name: N/A  . Number of children: 1  . Years of education: N/A   Occupational History  . cust. service    Social History Main Topics  . Smoking status: Never Smoker  . Smokeless tobacco: Never Used  . Alcohol use No  . Drug use: No  . Sexual activity: Not on file   Other Topics Concern  . Not on file   Social History Narrative  . No narrative on file    Allergies  Allergen Reactions  . Atorvastatin Other (See Comments)    Muscle pain  . Niacin Other (See Comments)    Tachycardia rate 160 beats / minute  . Simvastatin Other (See Comments)    Muscle pain  . Allopurinol Rash   Family History  Problem Relation Age of Onset  . Lung cancer Father   . Heart disease Father   . Heart disease Brother   . Diabetes Other      Past medical history, social, surgical and family history all reviewed in electronic medical record.  No pertanent information unless stated regarding to the chief complaint.   Review of Systems:Review of systems updated and as accurate as of 03/20/17  No headache, visual changes, nausea, vomiting, diarrhea, constipation, dizziness, abdominal pain, skin rash, fevers, chills, night sweats, weight loss, swollen lymph nodes, body aches, joint swelling, muscle aches, chest pain, shortness of breath, mood changes.   Objective  There were no vitals taken for this visit. Systems examined below as of 03/20/17   General: No apparent distress alert and oriented x3 mood and affect normal, dressed appropriately.  HEENT: Pupils equal, extraocular movements intact  Respiratory: Patient's speak in full sentences and does not appear short of breath  Cardiovascular: No lower extremity edema, non tender, no erythema  Skin: Warm dry intact with no signs of infection or rash on extremities or on axial skeleton.  Abdomen: Soft  nontender  Neuro: Cranial nerves II through XII are intact, neurovascularly intact in all extremities with 2+ DTRs and 2+ pulses.  Lymph: No lymphadenopathy of posterior or anterior cervical chain or axillae bilaterally.  Gait normal with good balance and coordination.  MSK:  Non tender with full range of motion and good stability and symmetric strength and tone of shoulders, elbows, wrist, hip, knee and ankles bilaterally.  Foot exam shows patient is very minimal tenderness over the heel area patient is in  regular shoes. Good range of motion of the ankle. Mild discomfort at the insertion of the Achilles.  Limited musculoskeletal ultrasound was performed and interpreted by Lyndal Pulley  Limited ultrasound shows the patient's fracture of the heel spur is completely healed at this time. Patient does have what appears to be a new nearly full rupture of the plantar fascia though. No increase in Doppler flow. Impression: Healed fracture, plantar fasciitis rupture   Impression and Recommendations:     This case required medical decision making of moderate complexity.      Note: This dictation was prepared with Dragon dictation along with smaller phrase technology. Any transcriptional errors that result from this process are unintentional.

## 2017-03-20 NOTE — Patient Instructions (Signed)
Good to see you  Ice when you need it Spenco orthotics "total support" online would be great  Continue the vitamin D and then switch to 2000 IU daily thereafter Also  Iron 65mg  daily and 500mg  vit C Turmeric 500mg  daily  Tart cherry extract any dose at night Try this first and if not better then see me again in 6-8 weeks and we will figure it out.

## 2017-05-15 ENCOUNTER — Ambulatory Visit: Payer: BLUE CROSS/BLUE SHIELD | Admitting: Family Medicine

## 2017-08-21 ENCOUNTER — Other Ambulatory Visit: Payer: Self-pay | Admitting: Internal Medicine

## 2017-08-25 ENCOUNTER — Other Ambulatory Visit: Payer: Self-pay | Admitting: Internal Medicine

## 2017-09-04 ENCOUNTER — Other Ambulatory Visit: Payer: Self-pay | Admitting: Internal Medicine

## 2017-09-12 ENCOUNTER — Other Ambulatory Visit: Payer: Self-pay | Admitting: Internal Medicine

## 2017-09-16 ENCOUNTER — Encounter: Payer: Self-pay | Admitting: Internal Medicine

## 2017-09-16 ENCOUNTER — Ambulatory Visit (INDEPENDENT_AMBULATORY_CARE_PROVIDER_SITE_OTHER): Payer: BLUE CROSS/BLUE SHIELD | Admitting: Internal Medicine

## 2017-09-16 VITALS — BP 122/84 | HR 83 | Temp 98.4°F | Ht 69.0 in | Wt 240.0 lb

## 2017-09-16 DIAGNOSIS — R7302 Impaired glucose tolerance (oral): Secondary | ICD-10-CM

## 2017-09-16 DIAGNOSIS — S92001D Unspecified fracture of right calcaneus, subsequent encounter for fracture with routine healing: Secondary | ICD-10-CM

## 2017-09-16 DIAGNOSIS — Z0000 Encounter for general adult medical examination without abnormal findings: Secondary | ICD-10-CM

## 2017-09-16 DIAGNOSIS — Z23 Encounter for immunization: Secondary | ICD-10-CM

## 2017-09-16 DIAGNOSIS — E559 Vitamin D deficiency, unspecified: Secondary | ICD-10-CM

## 2017-09-16 DIAGNOSIS — I872 Venous insufficiency (chronic) (peripheral): Secondary | ICD-10-CM | POA: Diagnosis not present

## 2017-09-16 MED ORDER — VITAMIN D (ERGOCALCIFEROL) 1.25 MG (50000 UNIT) PO CAPS: 50000.0000 [IU] | ORAL_CAPSULE | ORAL | 0 refills | Status: DC

## 2017-09-16 NOTE — Patient Instructions (Addendum)
You had the Pneumovax pneumonia shot today  Please take all new medication as prescribed - the vit D prescription, then OTC Vit D3 aat 2000 units per day  Please schedule the bone density test before leaving today at the scheduling desk (where you check out)  Please continue all other medications as before, and refills have been done if requested.  Please have the pharmacy call with any other refills you may need.  Please continue your efforts at being more active, low cholesterol diet, and weight control.  You are otherwise up to date with prevention measures today.  Please keep your appointments with your specialists as you may have planned  Please return in 1 year for your yearly visit, or sooner if needed, with Lab testing done at [facility] (same labs as this time)

## 2017-09-16 NOTE — Progress Notes (Signed)
Subjective:    Patient ID: Luis Myers, male    DOB: 03/22/1946, 72 y.o.   MRN: [id number]  HPI  Here for wellness and f/u;  Overall doing ok;  Pt denies Chest pain, worsening SOB, DOE, wheezing, orthopnea, PND, worsening LE edema, palpitations, dizziness or syncope.  Pt denies neurological change such as new headache, facial or extremity weakness.  Pt denies polydipsia, polyuria, or low sugar symptoms. Pt states overall good compliance with treatment and medications, good tolerability, and has been trying to follow appropriate diet.  Pt denies worsening depressive symptoms, suicidal ideation or panic. No fever, night sweats, wt loss, loss of appetite, or other constitutional symptoms.  Pt states good ability with ADL's, has low fall risk, home safety reviewed and adequate, no other significant changes in hearing or vision, and less active with exercise recently.  Had recent non traumatic closed right heel fx, has known low vit d recurring with recent labs, no known osteoporosis, but with more off balance to gait  Also has mild recent worsening left > right leg swelling , worse to be up during the day, better and resolved in the am.  No other interval hx or new complaints Past Medical History:  Diagnosis Date  . Acute gouty arthropathy 11/04/2008  . ALLERGIC RHINITIS 10/07/2007  . ANXIETY 03/04/2007  . BENIGN PROSTATIC HYPERTROPHY 03/04/2007  . CONJUNCTIVITIS, ACUTE, LEFT 11/27/2007  . GLUCOSE INTOLERANCE 04/23/2007  . GOUT 03/04/2007  . HYPERLIPIDEMIA 03/04/2007  . HYPERSOMNIA 02/20/2010  . HYPERTENSION 03/04/2007  . HYPOGONADISM 06/20/2010  . HYPOTHYROIDISM 03/04/2007  . Impaired fasting glucose 04/16/2007  . Impaired glucose tolerance 01/01/2011  . Morbid obesity ([place]) 03/04/2007  . PERIPHERAL EDEMA 11/04/2008  . Pure hypercholesterolemia 10/17/2008  . RASH-NONVESICULAR 07/25/2009  . SINUSITIS- ACUTE-NOS 05/16/2008  . URI 11/27/2007  . VITAMIN D DEFICIENCY 06/20/2010   Past Surgical History:    Procedure Laterality Date  . URETER SURGERY  1978   Stretched    reports that he has never smoked. He has never used smokeless tobacco. He reports that he does not drink alcohol or use drugs. family history includes Diabetes in his other; Heart disease in his brother and father; Lung cancer in his father. Allergies  Allergen Reactions  . Atorvastatin Other (See Comments)    Muscle pain  . Niacin Other (See Comments)    Tachycardia rate 160 beats / minute  . Simvastatin Other (See Comments)    Muscle pain  . Allopurinol Rash   Current Outpatient Medications on File Prior to Visit  Medication Sig Dispense Refill  . colchicine (COLCRYS) 0.6 MG tablet Take 1 tablet (0.6 mg total) by mouth 2 (two) times daily. Yearly physical due in March must see MD for future refills 180 tablet 3  . finasteride (PROSCAR) 5 MG tablet Take 1 tablet by mouth daily. Yearly physical w/labs due in March must see MD for refills 90 tablet 3  . indomethacin (INDOCIN) 50 MG capsule Take 1 capsule (50 mg total) by mouth 3 (three) times daily as needed. 90 capsule 2  . levothyroxine (SYNTHROID, LEVOTHROID) 100 MCG tablet Take 1 tablet (100 mcg total) by mouth daily. **PT NEEDS AN APPOINTMENT FOR ADDITIONAL REFILLS** 90 tablet 0  . lisinopril (PRINIVIL,ZESTRIL) 40 MG tablet Take 1 tablet (40 mg total) by mouth daily. --patient needs office visit before any further refills 90 tablet 0  . rosuvastatin (CRESTOR) 10 MG tablet TAKE 1 BY MOUTH DAILY 90 tablet 3  . tamsulosin (FLOMAX) 0.4 MG CAPS  capsule Take 1 capsule (0.4 mg total) by mouth daily. 90 capsule 3   No current facility-administered medications on file prior to visit.    Review of Systems Constitutional: Negative for other unusual diaphoresis, sweats, appetite or weight changes HENT: Negative for other worsening hearing loss, ear pain, facial swelling, mouth sores or neck stiffness.   Eyes: Negative for other worsening pain, redness or other visual  disturbance.  Respiratory: Negative for other stridor or swelling Cardiovascular: Negative for other palpitations or other chest pain  Gastrointestinal: Negative for worsening diarrhea or loose stools, blood in stool, distention or other pain Genitourinary: Negative for hematuria, flank pain or other change in urine volume.  Musculoskeletal: Negative for myalgias or other joint swelling.  Skin: Negative for other color change, or other wound or worsening drainage.  Neurological: Negative for other syncope or numbness. Hematological: Negative for other adenopathy or swelling Psychiatric/Behavioral: Negative for hallucinations, other worsening agitation, SI, self-injury, or new decreased concentration All other system neg per pt    Objective:   Physical Exam BP 122/84   Pulse 83   Temp 98.4 F (36.9 C) (Oral)   Ht 5\' 9"  (1.753 m)   Wt 240 lb (108.9 kg)   SpO2 97%   BMI 35.44 kg/m  VS noted,  Constitutional: Pt is oriented to person, place, and time. Appears well-developed and well-nourished, in no significant distress and comfortable Head: Normocephalic and atraumatic  Eyes: Conjunctivae and EOM are normal. Pupils are equal, round, and reactive to light Right Ear: External ear normal without discharge Left Ear: External ear normal without discharge Nose: Nose without discharge or deformity Mouth/Throat: Oropharynx is without other ulcerations and moist  Neck: Normal range of motion. Neck supple. No JVD present. No tracheal deviation present or significant neck LA or mass Cardiovascular: Normal rate, regular rhythm, normal heart sounds and intact distal pulses.   Pulmonary/Chest: WOB normal and breath sounds without rales or wheezing  Abdominal: Soft. Bowel sounds are normal. NT. No HSM  Musculoskeletal: Normal range of motion. Exhibits trace bilat edema Lymphadenopathy: Has no other cervical adenopathy.  Neurological: Pt is alert and oriented to person, place, and time. Pt has  normal reflexes. No cranial nerve deficit. Motor grossly intact, Gait intact Skin: Skin is warm and dry. No rash noted or new ulcerations Psychiatric:  Has normal mood and affect. Behavior is normal without agitation No other exam findings  Labs per [facility] 09/02/2017 reviewed with pt    Assessment & Plan:

## 2017-09-17 NOTE — Assessment & Plan Note (Signed)
Non traumatic, for DXA

## 2017-09-17 NOTE — Assessment & Plan Note (Signed)
Lab Results  Component Value Date   HGBA1C 5.9 09/02/2016  stable overall by history and exam, recent data reviewed with pt, and pt to continue medical treatment as before,  to f/u any worsening symptoms or concerns

## 2017-09-17 NOTE — Assessment & Plan Note (Signed)
Cont vit d replacement 

## 2017-09-17 NOTE — Assessment & Plan Note (Signed)
Mild, for compression stockings

## 2017-09-17 NOTE — Assessment & Plan Note (Signed)

## 2017-10-06 ENCOUNTER — Ambulatory Visit (INDEPENDENT_AMBULATORY_CARE_PROVIDER_SITE_OTHER)
Admission: RE | Admit: 2017-10-06 | Discharge: 2017-10-06 | Disposition: A | Discharge status: Home / Self Care | Payer: BLUE CROSS/BLUE SHIELD | Source: Ambulatory Visit | Attending: Internal Medicine | Admitting: Internal Medicine

## 2017-10-06 DIAGNOSIS — M85871 Other specified disorders of bone density and structure, right ankle and foot: Secondary | ICD-10-CM | POA: Diagnosis not present

## 2017-10-06 DIAGNOSIS — S92001D Unspecified fracture of right calcaneus, subsequent encounter for fracture with routine healing: Secondary | ICD-10-CM

## 2017-10-09 ENCOUNTER — Other Ambulatory Visit: Payer: Self-pay | Admitting: Internal Medicine

## 2017-10-12 ENCOUNTER — Encounter: Payer: Self-pay | Admitting: Internal Medicine

## 2017-10-12 DIAGNOSIS — M85871 Other specified disorders of bone density and structure, right ankle and foot: Secondary | ICD-10-CM | POA: Diagnosis not present

## 2017-10-14 ENCOUNTER — Other Ambulatory Visit: Payer: Self-pay | Admitting: Internal Medicine

## 2017-10-15 ENCOUNTER — Other Ambulatory Visit: Payer: Self-pay | Admitting: Internal Medicine

## 2017-10-20 ENCOUNTER — Telehealth: Payer: Self-pay

## 2017-10-20 ENCOUNTER — Telehealth: Payer: Self-pay | Admitting: Internal Medicine

## 2017-10-20 MED ORDER — COLCHICINE 0.6 MG PO TABS: 0.6000 mg | ORAL_TABLET | Freq: Two times a day (BID) | ORAL | 3 refills | Status: DC

## 2017-10-20 MED ORDER — FINASTERIDE 5 MG PO TABS: ORAL_TABLET | ORAL | 3 refills | Status: DC

## 2017-10-20 NOTE — Telephone Encounter (Signed)
Medication refilled on 5/13

## 2017-10-20 NOTE — Telephone Encounter (Signed)
Both medications has been sent in but pt stated that the mail order has not received either medication for finasteride and colchicine. I will resend both.    Copied from [place] [phone]. Topic: General - Other >> Oct 20, 2017  9:49 AM Lennox Solders wrote: Reason for CRM: pt had a physical with dr Jenny Reichmann in April 2019. Pt would like to know why colchicine was denied.

## 2017-10-20 NOTE — Telephone Encounter (Signed)
Copied from [place] [phone]. Topic: Quick Communication - Rx Refill/Question >> Oct 20, 2017  9:52 AM Lennox Solders wrote: Medication: finasteride 5 mg #90 Has the patient contacted their pharmacy? Yes [facility]

## 2017-10-21 ENCOUNTER — Telehealth: Payer: Self-pay | Admitting: Internal Medicine

## 2017-10-21 MED ORDER — COLCHICINE 0.6 MG PO TABS: 0.6000 mg | ORAL_TABLET | Freq: Every day | ORAL | 3 refills | Status: DC

## 2017-10-21 NOTE — Telephone Encounter (Signed)
Colchicine changed to daily only as the BID was not approved per insurance

## 2017-10-21 NOTE — Telephone Encounter (Signed)
Pt's wife has been informed and expressed understanding. 

## 2017-10-22 ENCOUNTER — Other Ambulatory Visit: Payer: Self-pay | Admitting: *Deleted

## 2017-10-22 MED ORDER — COLCHICINE 0.6 MG PO TABS: 0.6000 mg | ORAL_TABLET | Freq: Every day | ORAL | 3 refills | Status: DC

## 2017-10-22 NOTE — Telephone Encounter (Signed)
Pt medication was sent to the wrong pharmacy.  Correct pharmacy:  [facility], [address] AT [address] [phone] (Phone) [phone] (Fax)

## 2017-10-22 NOTE — Telephone Encounter (Signed)
Pharmacy called to confirm that prescription for Finasteride had been received. Spoke with [employer] and [place] who states that prescription was received and was shipped on yesterday.  Pt called and notified that medication was received and pt states that it was Colchicine that was sent to the wrong pharmacy on yesterday. Colchicine resent to requested pharmacy.

## 2017-10-24 ENCOUNTER — Telehealth: Payer: Self-pay | Admitting: Internal Medicine

## 2017-10-24 NOTE — Telephone Encounter (Signed)
Copied from [place] [phone]. Topic: Quick Communication - See Telephone Encounter >> Oct 24, 2017  2:06 PM Bea Graff, NT wrote: CRM for notification. See Telephone encounter for: 10/24/17. Lilly with [employer] is calling and states pts insurance will not cover the generic brand of the colchicine 0.6 MG tablet and they would like to see if the doctor wants to do PA or order the name brand. CB#: [id number] Ref#: [id number]

## 2017-10-24 NOTE — Telephone Encounter (Signed)
PA has already been done with a denial result. Dr. Jenny Reichmann has changed script to say 1 tablet daily instead of 2 since 2 was not approved.

## 2017-10-27 ENCOUNTER — Telehealth: Payer: Self-pay | Admitting: Internal Medicine

## 2017-10-27 NOTE — Telephone Encounter (Signed)
Copied from [place] [phone]. Topic: Quick Communication - See Telephone Encounter >> Oct 27, 2017 11:32 AM Percell Belt A wrote: CRM for notification. See Telephone encounter for: 10/27/17. Mady Haagensen [employer] called in and stated that the colchicine 0.6 MG tablet [[id number]] needs to be called in for just brand name only.  Ins will only over brand name.  They also are unclear of directions.  They rec'd 2 different instructions.    Please contact them at [phone]

## 2017-10-28 NOTE — Telephone Encounter (Signed)
[facility] spoke w/pharmacy rep inform insurance will not cover generic colchicine, but will cover the Luis Myers colcrys, also need to verify directions. They received two directions. Inform rep ok to change to Luis Myers name, also per chart pt should be taking 1 pill a day....[name]

## 2017-10-28 NOTE — Telephone Encounter (Signed)
Pt calling to f/u on request for medication update. He has been going back and forth since 10/22/17

## 2017-11-11 ENCOUNTER — Other Ambulatory Visit: Payer: Self-pay | Admitting: Internal Medicine

## 2017-11-15 ENCOUNTER — Other Ambulatory Visit: Payer: Self-pay | Admitting: Internal Medicine

## 2017-11-30 ENCOUNTER — Other Ambulatory Visit: Payer: Self-pay | Admitting: Internal Medicine

## 2017-12-01 ENCOUNTER — Other Ambulatory Visit: Payer: Self-pay | Admitting: Internal Medicine

## 2018-02-06 ENCOUNTER — Other Ambulatory Visit: Payer: Self-pay | Admitting: Internal Medicine

## 2018-04-21 ENCOUNTER — Ambulatory Visit: Payer: Self-pay

## 2018-04-21 NOTE — Telephone Encounter (Signed)
Summary: please advise   Pt has non painful lump in groin area that appeared on Sunday. Pt decline to see another provider. Dr Jenny Reichmann next open that I schedule him for is tomorrow 04-22-18. Please advise     Patient reports no pain, fever or sign of infection in the are- he has been scheduled for tomorrow with Dr Jenny Reichmann. He is aware to call back if he has any changes in his present condition. Reason for Disposition . [1] Single large node AND [2] size > 1 inch (2.5 cm) AND [3] no fever  Answer Assessment - Initial Assessment Questions 1. LOCATION: "Where is the swollen node located?" "Is the matching node on the other side of the body also swollen?"      R groin area- feels more when standing 2. SIZE: "How big is the node?" (Inches or centimeters) (or compare to common objects such as pea, bean, marble, golf ball)      golf ball size 3. ONSET: "When did the swelling start?"      Discovered Sunday morning 4. NECK NODES: "Is there a sore throat, runny nose or other symptoms of a cold?"      no 5. GROIN OR ARMPIT NODES: "Is there a sore, scratch, cut or painful red area on that arm or leg?"      No- only feels when palpitating not sore 6. FEVER: "Do you have a fever?" If so, ask: "What is it, how was it measured, and when did it start?"      no 7. CAUSE: "What do you think is causing the swollen lymph nodes?"     Possible hernia 8. OTHER SYMPTOMS: "Do you have any other symptoms?"     No other symptoms 9. PREGNANCY: "Is there any chance you are pregnant?" "When was your last menstrual period?"     n/a  Protocols used: [place]

## 2018-04-22 ENCOUNTER — Ambulatory Visit: Payer: BLUE CROSS/BLUE SHIELD | Admitting: Internal Medicine

## 2018-04-22 ENCOUNTER — Encounter: Payer: Self-pay | Admitting: Internal Medicine

## 2018-04-22 VITALS — BP 142/90 | HR 89 | Temp 98.5°F | Ht 69.0 in | Wt 256.0 lb

## 2018-04-22 DIAGNOSIS — R7302 Impaired glucose tolerance (oral): Secondary | ICD-10-CM | POA: Diagnosis not present

## 2018-04-22 DIAGNOSIS — K4090 Unilateral inguinal hernia, without obstruction or gangrene, not specified as recurrent: Secondary | ICD-10-CM

## 2018-04-22 DIAGNOSIS — I10 Essential (primary) hypertension: Secondary | ICD-10-CM

## 2018-04-22 NOTE — Assessment & Plan Note (Signed)
stable overall by history and exam, recent data reviewed with pt, and pt to continue medical treatment as before,  to f/u any worsening symptoms or concerns  

## 2018-04-22 NOTE — Patient Instructions (Signed)
Please call if you would want the referral to General Surgury for the right side hernia  Please continue all other medications as before, and refills have been done if requested.  Please have the pharmacy call with any other refills you may need.  Please continue your efforts at being more active, low cholesterol diet, and weight control.  Please keep your appointments with your specialists as you may have planned

## 2018-04-22 NOTE — Progress Notes (Signed)
Subjective:    Patient ID: Luis Myers, male    DOB: 23-Oct-1945, 72 y.o.   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  Denies worsening reflux, abd pain, dysphagia, n/v, bowel change or blood.  No prior hx of same or prior surgury.  Pt denies chest pain, increased sob or doe, wheezing, orthopnea, PND, increased LE swelling, palpitations, dizziness or syncope.   Pt denies polydipsia, polyuria Past Medical History:  Diagnosis Date  . Acute gouty arthropathy 11/04/2008  . ALLERGIC RHINITIS 10/07/2007  . ANXIETY 03/04/2007  . BENIGN PROSTATIC HYPERTROPHY 03/04/2007  . CONJUNCTIVITIS, ACUTE, LEFT 11/27/2007  . GLUCOSE INTOLERANCE 04/23/2007  . GOUT 03/04/2007  . HYPERLIPIDEMIA 03/04/2007  . HYPERSOMNIA 02/20/2010  . HYPERTENSION 03/04/2007  . HYPOGONADISM 06/20/2010  . HYPOTHYROIDISM 03/04/2007  . Impaired fasting glucose 04/16/2007  . Impaired glucose tolerance 01/01/2011  . Morbid obesity ([place]) 03/04/2007  . PERIPHERAL EDEMA 11/04/2008  . Pure hypercholesterolemia 10/17/2008  . RASH-NONVESICULAR 07/25/2009  . SINUSITIS- ACUTE-NOS 05/16/2008  . URI 11/27/2007  . VITAMIN D DEFICIENCY 06/20/2010   Past Surgical History:  Procedure Laterality Date  . URETER SURGERY  1978   Stretched    reports that he has never smoked. He has never used smokeless tobacco. He reports that he does not drink alcohol or use drugs. family history includes Diabetes in his other; Heart disease in his brother and father; Lung cancer in his father. Allergies  Allergen Reactions  . Atorvastatin Other (See Comments)    Muscle pain  . Niacin Other (See Comments)    Tachycardia rate 160 beats / minute  . Simvastatin Other (See Comments)    Muscle pain  . Allopurinol Rash   Current  Outpatient Medications on File Prior to Visit  Medication Sig Dispense Refill  . colchicine 0.6 MG tablet Take 1 tablet (0.6 mg total) by mouth daily. 90 tablet 3  . finasteride (PROSCAR) 5 MG tablet Take 1 tablet by mouth daily. Yearly physical w/labs due in March must see MD for refills 90 tablet 3  . indomethacin (INDOCIN) 50 MG capsule Take 1 capsule (50 mg total) by mouth 3 (three) times daily as needed. 90 capsule 2  . levothyroxine (SYNTHROID, LEVOTHROID) 100 MCG tablet TAKE 1 TABLET BY MOUTH DAILY. PATIENT NEEDS APPOINTMENT FOR ADDITIONAL REFILLS 90 tablet 0  . lisinopril (PRINIVIL,ZESTRIL) 40 MG tablet Take 1 tablet (40 mg total) by mouth daily. 90 tablet 3  . rosuvastatin (CRESTOR) 10 MG tablet TAKE 1 TABLET BY MOUTH DAILY 90 tablet 2  . tamsulosin (FLOMAX) 0.4 MG CAPS capsule TAKE ONE CAPSULE BY MOUTH DAILY 90 capsule 3  . Vitamin D, Ergocalciferol, (DRISDOL) 50000 units CAPS capsule Take 1 capsule (50,000 Units total) by mouth every 7 (seven) days. 12 capsule 0   No current facility-administered medications on file prior to visit.    Review of Systems  Constitutional: Negative for other unusual diaphoresis or sweats HENT: Negative for ear discharge or swelling Eyes: Negative for other worsening visual disturbances Respiratory: Negative for stridor or other swelling  Gastrointestinal: Negative for worsening distension or other blood Genitourinary: Negative for retention or other urinary change Musculoskeletal: Negative for other MSK pain or swelling Skin: Negative for color  change or other new lesions Neurological: Negative for worsening tremors and other numbness  Psychiatric/Behavioral: Negative for worsening agitation or other fatigue All other system neg per pt    Objective:   Physical Exam BP (!) 142/90   Pulse 89   Temp 98.5 F (36.9 C) (Oral)   Ht 5\' 9"  (1.753 m)   Wt 256 lb (116.1 kg)   SpO2 98%   BMI 37.80 kg/m  VS noted,  Constitutional: Pt appears in  NAD HENT: Head: NCAT.  Right Ear: External ear normal.  Left Ear: External ear normal.  Eyes: . Pupils are equal, round, and reactive to light. Conjunctivae and EOM are normal Nose: without d/c or deformity Neck: Neck supple. Gross normal ROM Cardiovascular: Normal rate and regular rhythm.   Pulmonary/Chest: Effort normal and breath sounds without rales or wheezing.  Abd:  Soft, NT, ND, + BS, no organomegaly Right groin with 1-2+ swelling non tender and reducible Neurological: Pt is alert. At baseline orientation, motor grossly intact Skin: Skin is warm. No rashes, other new lesions, no LE edema Psychiatric: Pt behavior is normal without agitation  No other exam findings  Lab Results  Component Value Date   WBC 5.0 09/02/2016   HGB 13.9 09/02/2016   HCT 42 09/02/2016   PLT 152 09/02/2016   GLUCOSE 99 07/19/2014   CHOL 144 09/02/2016   TRIG 230 (A) 09/02/2016   HDL 38 09/02/2016   LDLDIRECT 74.0 07/19/2014   LDLCALC 60 09/02/2016   ALT 15 07/19/2014   AST 15 07/19/2014   NA 139 07/19/2014   K 4.6 07/19/2014   CL 104 07/19/2014   CREATININE 1.24 07/19/2014   BUN 12 09/02/2016   CO2 29 07/19/2014   TSH 6.37 (A) 09/02/2016   PSA 0.17 07/19/2014   HGBA1C 5.9 09/02/2016   MICROALBUR <0.7 07/19/2014        Assessment & Plan:

## 2018-04-22 NOTE — Assessment & Plan Note (Signed)
New onset, no pain and reducible, but I suggested elective surgury to be considered, and he will let us know if he wants to do this

## 2018-04-28 ENCOUNTER — Other Ambulatory Visit: Payer: Self-pay | Admitting: Internal Medicine

## 2018-06-15 ENCOUNTER — Telehealth: Payer: Self-pay

## 2018-06-15 DIAGNOSIS — K4090 Unilateral inguinal hernia, without obstruction or gangrene, not specified as recurrent: Secondary | ICD-10-CM

## 2018-06-15 NOTE — Telephone Encounter (Signed)
Copied from [place] [phone] [phone]. Topic: Referral - Request for Referral >> Jun 15, 2018 10:58 AM Lennox Solders wrote: Has patient seen PCP for this complaint? 04-22-2018  for hernia in groin area. Pt is ready to have a refer to general surgery. Pt has  BCBS/MEDICARE  insurance. Please call pt on cell and then home phone

## 2018-06-16 NOTE — Telephone Encounter (Signed)
Ok, this is done 

## 2018-06-16 NOTE — Addendum Note (Signed)
Addended by: Biagio Borg on: 06/16/2018 12:46 PM   Modules accepted: Orders

## 2018-06-16 NOTE — Telephone Encounter (Signed)
Referral has been sent to [facility]. Pt is aware

## 2018-07-10 DIAGNOSIS — K4090 Unilateral inguinal hernia, without obstruction or gangrene, not specified as recurrent: Secondary | ICD-10-CM | POA: Diagnosis not present

## 2018-08-04 ENCOUNTER — Other Ambulatory Visit: Payer: Self-pay | Admitting: Internal Medicine

## 2018-08-14 DIAGNOSIS — K4090 Unilateral inguinal hernia, without obstruction or gangrene, not specified as recurrent: Secondary | ICD-10-CM | POA: Diagnosis not present

## 2018-08-29 ENCOUNTER — Other Ambulatory Visit: Payer: Self-pay | Admitting: Internal Medicine

## 2018-09-12 ENCOUNTER — Other Ambulatory Visit: Payer: Self-pay | Admitting: Internal Medicine

## 2018-09-21 ENCOUNTER — Encounter: Payer: BLUE CROSS/BLUE SHIELD | Admitting: Internal Medicine

## 2018-09-25 ENCOUNTER — Other Ambulatory Visit: Payer: Self-pay | Admitting: Internal Medicine

## 2018-10-03 ENCOUNTER — Other Ambulatory Visit: Payer: Self-pay | Admitting: Internal Medicine

## 2018-10-09 ENCOUNTER — Other Ambulatory Visit: Payer: Self-pay | Admitting: Internal Medicine

## 2018-11-04 ENCOUNTER — Encounter: Payer: BLUE CROSS/BLUE SHIELD | Admitting: Internal Medicine

## 2018-12-25 ENCOUNTER — Other Ambulatory Visit: Payer: Self-pay | Admitting: Internal Medicine

## 2019-01-01 ENCOUNTER — Other Ambulatory Visit: Payer: Self-pay | Admitting: Internal Medicine

## 2019-01-04 DIAGNOSIS — D225 Melanocytic nevi of trunk: Secondary | ICD-10-CM | POA: Diagnosis not present

## 2019-01-04 DIAGNOSIS — C4441 Basal cell carcinoma of skin of scalp and neck: Secondary | ICD-10-CM | POA: Diagnosis not present

## 2019-01-04 DIAGNOSIS — B078 Other viral warts: Secondary | ICD-10-CM | POA: Diagnosis not present

## 2019-01-04 DIAGNOSIS — X32XXXA Exposure to sunlight, initial encounter: Secondary | ICD-10-CM | POA: Diagnosis not present

## 2019-01-04 DIAGNOSIS — L570 Actinic keratosis: Secondary | ICD-10-CM | POA: Diagnosis not present

## 2019-01-06 ENCOUNTER — Ambulatory Visit (INDEPENDENT_AMBULATORY_CARE_PROVIDER_SITE_OTHER): Payer: BC Managed Care – PPO | Admitting: Internal Medicine

## 2019-01-06 ENCOUNTER — Other Ambulatory Visit: Payer: Self-pay

## 2019-01-06 ENCOUNTER — Other Ambulatory Visit (INDEPENDENT_AMBULATORY_CARE_PROVIDER_SITE_OTHER): Payer: BC Managed Care – PPO

## 2019-01-06 ENCOUNTER — Encounter: Payer: Self-pay | Admitting: Internal Medicine

## 2019-01-06 VITALS — BP 126/86 | HR 81 | Temp 97.6°F | Ht 69.0 in | Wt 234.0 lb

## 2019-01-06 DIAGNOSIS — E559 Vitamin D deficiency, unspecified: Secondary | ICD-10-CM

## 2019-01-06 DIAGNOSIS — Z1159 Encounter for screening for other viral diseases: Secondary | ICD-10-CM

## 2019-01-06 DIAGNOSIS — E611 Iron deficiency: Secondary | ICD-10-CM

## 2019-01-06 DIAGNOSIS — Z0000 Encounter for general adult medical examination without abnormal findings: Secondary | ICD-10-CM | POA: Diagnosis not present

## 2019-01-06 DIAGNOSIS — Z125 Encounter for screening for malignant neoplasm of prostate: Secondary | ICD-10-CM | POA: Diagnosis not present

## 2019-01-06 DIAGNOSIS — E039 Hypothyroidism, unspecified: Secondary | ICD-10-CM

## 2019-01-06 DIAGNOSIS — R7302 Impaired glucose tolerance (oral): Secondary | ICD-10-CM | POA: Diagnosis not present

## 2019-01-06 DIAGNOSIS — E538 Deficiency of other specified B group vitamins: Secondary | ICD-10-CM

## 2019-01-06 LAB — IBC PANEL
Iron: 70 ug/dL (ref 42–165)
Saturation Ratios: 18.2 % — ABNORMAL LOW (ref 20.0–50.0)
Transferrin: 274 mg/dL (ref 212.0–360.0)

## 2019-01-06 LAB — CBC WITH DIFFERENTIAL/PLATELET
Basophils Absolute: 0 10*3/uL (ref 0.0–0.1)
Basophils Relative: 0.6 % (ref 0.0–3.0)
Eosinophils Absolute: 0.1 10*3/uL (ref 0.0–0.7)
Eosinophils Relative: 2.4 % (ref 0.0–5.0)
HCT: 43.5 % (ref 39.0–52.0)
Hemoglobin: 14.4 g/dL (ref 13.0–17.0)
Lymphocytes Relative: 37.6 % (ref 12.0–46.0)
Lymphs Abs: 2 10*3/uL (ref 0.7–4.0)
MCHC: 33.1 g/dL (ref 30.0–36.0)
MCV: 88.8 fl (ref 78.0–100.0)
Monocytes Absolute: 0.6 10*3/uL (ref 0.1–1.0)
Monocytes Relative: 10.8 % (ref 3.0–12.0)
Neutro Abs: 2.6 10*3/uL (ref 1.4–7.7)
Neutrophils Relative %: 48.6 % (ref 43.0–77.0)
Platelets: 143 10*3/uL — ABNORMAL LOW (ref 150.0–400.0)
RBC: 4.9 Mil/uL (ref 4.22–5.81)
RDW: 14.1 % (ref 11.5–15.5)
WBC: 5.3 10*3/uL (ref 4.0–10.5)

## 2019-01-06 LAB — TSH: TSH: 0.99 u[IU]/mL (ref 0.35–4.50)

## 2019-01-06 LAB — HEPATIC FUNCTION PANEL
ALT: 12 U/L (ref 0–53)
AST: 13 U/L (ref 0–37)
Albumin: 4.6 g/dL (ref 3.5–5.2)
Alkaline Phosphatase: 77 U/L (ref 39–117)
Bilirubin, Direct: 0.1 mg/dL (ref 0.0–0.3)
Total Bilirubin: 0.7 mg/dL (ref 0.2–1.2)
Total Protein: 7.3 g/dL (ref 6.0–8.3)

## 2019-01-06 LAB — BASIC METABOLIC PANEL
BUN: 16 mg/dL (ref 6–23)
CO2: 29 mEq/L (ref 19–32)
Calcium: 9.7 mg/dL (ref 8.4–10.5)
Chloride: 104 mEq/L (ref 96–112)
Creatinine, Ser: 1.3 mg/dL (ref 0.40–1.50)
GFR: 54.42 mL/min — ABNORMAL LOW (ref >60.00)
Glucose, Bld: 117 mg/dL — ABNORMAL HIGH (ref 70–99)
Potassium: 4.6 mEq/L (ref 3.5–5.1)
Sodium: 141 mEq/L (ref 135–145)

## 2019-01-06 LAB — URINALYSIS, ROUTINE W REFLEX MICROSCOPIC
Bilirubin Urine: NEGATIVE
Hgb urine dipstick: NEGATIVE
Ketones, ur: NEGATIVE
Leukocytes,Ua: NEGATIVE
Nitrite: NEGATIVE
RBC / HPF: NONE SEEN (ref 0–?)
Specific Gravity, Urine: 1.025 (ref 1.000–1.030)
Total Protein, Urine: NEGATIVE
Urine Glucose: NEGATIVE
Urobilinogen, UA: 0.2 (ref 0.0–1.0)
pH: 6 (ref 5.0–8.0)

## 2019-01-06 LAB — PSA: PSA: 0.12 ng/mL (ref 0.10–4.00)

## 2019-01-06 LAB — VITAMIN B12: Vitamin B-12: 139 pg/mL — ABNORMAL LOW (ref 211–911)

## 2019-01-06 LAB — VITAMIN D 25 HYDROXY (VIT D DEFICIENCY, FRACTURES): VITD: 15.02 ng/mL — ABNORMAL LOW (ref 30.00–100.00)

## 2019-01-06 LAB — T4, FREE: Free T4: 1.03 ng/dL (ref 0.60–1.60)

## 2019-01-06 NOTE — Assessment & Plan Note (Signed)

## 2019-01-06 NOTE — Progress Notes (Signed)
Subjective:    Patient ID: Luis Myers, male    DOB: Dec 08, 1945, 73 y.o.   MRN: [id number]  HPI  Here for wellness and f/u;  Overall doing ok;  Pt denies Chest pain, worsening SOB, DOE, wheezing, orthopnea, PND, worsening LE edema, palpitations, dizziness or syncope.  Pt denies neurological change such as new headache, facial or extremity weakness.  Pt denies polydipsia, polyuria, or low sugar symptoms. Pt states overall good compliance with treatment and medications, good tolerability, and has been trying to follow appropriate diet.  Pt denies worsening depressive symptoms, suicidal ideation or panic. No fever, night sweats, wt loss, loss of appetite, or other constitutional symptoms.  Pt states good ability with ADL's, has low fall risk, home safety reviewed and adequate, no other significant changes in hearing or vision, and only occasionally active with exercise. Wt Readings from Last 3 Encounters:  01/06/19 234 lb (106.1 kg)  04/22/18 256 lb (116.1 kg)  09/16/17 240 lb (108.9 kg)  Just had skin cancer off forehead, for f/u derm next wk.   Past Medical History:  Diagnosis Date  . Acute gouty arthropathy 11/04/2008  . ALLERGIC RHINITIS 10/07/2007  . ANXIETY 03/04/2007  . BENIGN PROSTATIC HYPERTROPHY 03/04/2007  . CONJUNCTIVITIS, ACUTE, LEFT 11/27/2007  . GLUCOSE INTOLERANCE 04/23/2007  . GOUT 03/04/2007  . HYPERLIPIDEMIA 03/04/2007  . HYPERSOMNIA 02/20/2010  . HYPERTENSION 03/04/2007  . HYPOGONADISM 06/20/2010  . HYPOTHYROIDISM 03/04/2007  . Impaired fasting glucose 04/16/2007  . Impaired glucose tolerance 01/01/2011  . Morbid obesity ([place]) 03/04/2007  . PERIPHERAL EDEMA 11/04/2008  . Pure hypercholesterolemia 10/17/2008  . RASH-NONVESICULAR 07/25/2009  . SINUSITIS- ACUTE-NOS 05/16/2008  . URI 11/27/2007  . VITAMIN D DEFICIENCY 06/20/2010   Past Surgical History:  Procedure Laterality Date  . URETER SURGERY  1978   Stretched    reports that he has never smoked. He has never used smokeless  tobacco. He reports that he does not drink alcohol or use drugs. family history includes Diabetes in an other family member; Heart disease in his brother and father; Lung cancer in his father. Allergies  Allergen Reactions  . Atorvastatin Other (See Comments)    Muscle pain  . Niacin Other (See Comments)    Tachycardia rate 160 beats / minute  . Simvastatin Other (See Comments)    Muscle pain  . Allopurinol Rash   Current Outpatient Medications on File Prior to Visit  Medication Sig Dispense Refill  . finasteride (PROSCAR) 5 MG tablet TAKE 1 TABLET BY MOUTH DAILY GENERIC EQUIVALENT FOR PROSCAR 90 tablet 1  . indomethacin (INDOCIN) 50 MG capsule Take 1 capsule (50 mg total) by mouth 3 (three) times daily as needed. 90 capsule 2  . levothyroxine (SYNTHROID) 100 MCG tablet TAKE 1 TABLET BY MOUTH DAILY 90 tablet 0  . lisinopril (ZESTRIL) 40 MG tablet TAKE 1 TABLET BY MOUTH DAILY 90 tablet 0  . MITIGARE 0.6 MG CAPS TAKE 1 CAPSULE BY MOUTH EVERY DAY 90 capsule 1  . rosuvastatin (CRESTOR) 10 MG tablet TAKE 1 TABLET BY MOUTH DAILY 90 tablet 1  . tamsulosin (FLOMAX) 0.4 MG CAPS capsule TAKE ONE CAPSULE BY MOUTH DAILY 90 capsule 1  . Vitamin D, Ergocalciferol, (DRISDOL) 50000 units CAPS capsule Take 1 capsule (50,000 Units total) by mouth every 7 (seven) days. 12 capsule 0   No current facility-administered medications on file prior to visit.    Review of Systems Constitutional: Negative for other unusual diaphoresis, sweats, appetite or weight changes HENT: Negative for other  worsening hearing loss, ear pain, facial swelling, mouth sores or neck stiffness.   Eyes: Negative for other worsening pain, redness or other visual disturbance.  Respiratory: Negative for other stridor or swelling Cardiovascular: Negative for other palpitations or other chest pain  Gastrointestinal: Negative for worsening diarrhea or loose stools, blood in stool, distention or other pain Genitourinary: Negative for  hematuria, flank pain or other change in urine volume.  Musculoskeletal: Negative for myalgias or other joint swelling.  Skin: Negative for other color change, or other wound or worsening drainage.  Neurological: Negative for other syncope or numbness. Hematological: Negative for other adenopathy or swelling Psychiatric/Behavioral: Negative for hallucinations, other worsening agitation, SI, self-injury, or new decreased concentration ALl other system neg per pt    Objective:   Physical Exam BP 126/86   Pulse 81   Temp 97.6 F (36.4 C) (Oral)   Ht 5\' 9"  (1.753 m)   Wt 234 lb (106.1 kg)   SpO2 97%   BMI 34.56 kg/m  VS noted,  Constitutional: Pt is oriented to person, place, and time. Appears well-developed and well-nourished, in no significant distress and comfortable Head: Normocephalic and atraumatic  Eyes: Conjunctivae and EOM are normal. Pupils are equal, round, and reactive to light Right Ear: External ear normal without discharge Left Ear: External ear normal without discharge Nose: Nose without discharge or deformity Mouth/Throat: Oropharynx is without other ulcerations and moist  Neck: Normal range of motion. Neck supple. No JVD present. No tracheal deviation present or significant neck LA or mass Cardiovascular: Normal rate, regular rhythm, normal heart sounds and intact distal pulses.   Pulmonary/Chest: WOB normal and breath sounds without rales or wheezing  Abdominal: Soft. Bowel sounds are normal. NT. No HSM  Musculoskeletal: Normal range of motion. Exhibits no edema Lymphadenopathy: Has no other cervical adenopathy.  Neurological: Pt is alert and oriented to person, place, and time. Pt has normal reflexes. No cranial nerve deficit. Motor grossly intact, Gait intact Skin: Skin is warm and dry. No rash noted or new ulcerations Psychiatric:  Has normal mood and affect. Behavior is normal without agitation No other exam findings Lab Results  Component Value Date   WBC  5.3 01/06/2019   HGB 14.4 01/06/2019   HCT 43.5 01/06/2019   PLT 143.0 (L) 01/06/2019   GLUCOSE 117 (H) 01/06/2019   CHOL 144 09/02/2016   TRIG 230 (A) 09/02/2016   HDL 38 09/02/2016   LDLDIRECT 74.0 07/19/2014   LDLCALC 60 09/02/2016   ALT 12 01/06/2019   AST 13 01/06/2019   NA 141 01/06/2019   K 4.6 01/06/2019   CL 104 01/06/2019   CREATININE 1.30 01/06/2019   BUN 16 01/06/2019   CO2 29 01/06/2019   TSH 0.99 01/06/2019   PSA 0.12 01/06/2019   HGBA1C 5.9 09/02/2016   MICROALBUR <0.7 07/19/2014       Assessment & Plan:

## 2019-01-06 NOTE — Assessment & Plan Note (Signed)
stable overall by history and exam, recent data reviewed with pt, and pt to continue medical treatment as before,  to f/u any worsening symptoms or concerns  

## 2019-01-06 NOTE — Assessment & Plan Note (Signed)
For f/u lab 

## 2019-01-06 NOTE — Patient Instructions (Signed)

## 2019-01-07 ENCOUNTER — Encounter: Payer: Self-pay | Admitting: Internal Medicine

## 2019-01-07 ENCOUNTER — Other Ambulatory Visit: Payer: Self-pay | Admitting: Internal Medicine

## 2019-01-07 LAB — HEPATITIS C ANTIBODY
Hepatitis C Ab: NONREACTIVE
SIGNAL TO CUT-OFF: 0.01 (ref <1.00)

## 2019-01-07 MED ORDER — VITAMIN D (ERGOCALCIFEROL) 1.25 MG (50000 UNIT) PO CAPS: 50000.0000 [IU] | ORAL_CAPSULE | ORAL | 0 refills | Status: DC

## 2019-01-11 ENCOUNTER — Telehealth: Payer: Self-pay

## 2019-01-11 NOTE — Telephone Encounter (Signed)
Called pt, LVM.   CRM created.  

## 2019-01-11 NOTE — Telephone Encounter (Signed)
-----   Message from Biagio Borg, MD sent at 01/07/2019  7:49 PM EDT ----- Left message on MyChart, pt to cont same tx except  The test results show that your current treatment is OK, as the tests are stable, except the Vitamin D and Vitamin B12 levels are low.    We need to: 1)  Please take Vitamin D 50000 units weekly for 12 weeks, then plan to change to OTC Vitamin D3 at 2000 units per day, indefinitely. 2)  Start monthly B12 shots in the office until next visit (the office will call)    Zella Dewan to please inform pt, I will do rx x 1, and help arrange for b12 shots

## 2019-01-15 ENCOUNTER — Other Ambulatory Visit: Payer: Self-pay | Admitting: Internal Medicine

## 2019-02-01 DIAGNOSIS — C4441 Basal cell carcinoma of skin of scalp and neck: Secondary | ICD-10-CM | POA: Diagnosis not present

## 2019-02-12 ENCOUNTER — Other Ambulatory Visit: Payer: Self-pay | Admitting: Internal Medicine

## 2019-02-19 DIAGNOSIS — H5203 Hypermetropia, bilateral: Secondary | ICD-10-CM | POA: Diagnosis not present

## 2019-02-22 DIAGNOSIS — C4441 Basal cell carcinoma of skin of scalp and neck: Secondary | ICD-10-CM | POA: Diagnosis not present

## 2019-03-25 DIAGNOSIS — Z08 Encounter for follow-up examination after completed treatment for malignant neoplasm: Secondary | ICD-10-CM | POA: Diagnosis not present

## 2019-03-25 DIAGNOSIS — Z85828 Personal history of other malignant neoplasm of skin: Secondary | ICD-10-CM | POA: Diagnosis not present

## 2019-04-06 ENCOUNTER — Telehealth: Payer: Self-pay | Admitting: Internal Medicine

## 2019-04-06 MED ORDER — MITIGARE 0.6 MG PO CAPS: 1.0000 | ORAL_CAPSULE | Freq: Every day | ORAL | 1 refills | Status: DC

## 2019-04-06 NOTE — Telephone Encounter (Signed)
rx refill MITIGARE 0.6 MG CAPS  [facility] - [place] [place] AT [place] [phone] (Phone) [phone] (Fax)

## 2019-04-12 MED ORDER — FINASTERIDE 5 MG PO TABS: ORAL_TABLET | ORAL | 1 refills | Status: DC

## 2019-04-12 NOTE — Telephone Encounter (Signed)
Rx sent. See meds.  

## 2019-04-12 NOTE — Telephone Encounter (Signed)
Pt called in and stated that has not been resent to  [place], [place] [phone] (Phone)   He would like to see if this can be resent today finasteride (PROSCAR) 5 MG tablet [id number]  Pt also would like [facility] sent [facility] to [facility] mail order as well

## 2019-04-12 NOTE — Addendum Note (Signed)
Addended by: Cresenciano Lick on: 04/12/2019 10:42 AM   Modules accepted: Orders

## 2019-04-13 ENCOUNTER — Telehealth: Payer: Self-pay | Admitting: Internal Medicine

## 2019-04-13 MED ORDER — FINASTERIDE 5 MG PO TABS: ORAL_TABLET | ORAL | 3 refills | Status: DC

## 2019-04-13 NOTE — Telephone Encounter (Signed)
I called [facility]- they are requesting to add additional refills to patient's rx for Finesteride. I authorized refills for # 90 with 3 refills.  See meds.

## 2019-04-13 NOTE — Telephone Encounter (Signed)
Copied from [place] [id number]. Topic: General - Call Back - No Documentation >> Apr 13, 2019 12:57 PM Erick Blinks wrote: Reason for CRM: [facility] called requesting call back regarding pt's finasteride. Please advise  [phone]

## 2019-06-08 ENCOUNTER — Other Ambulatory Visit: Payer: Self-pay | Admitting: Internal Medicine

## 2019-06-08 MED ORDER — LEVOTHYROXINE SODIUM 100 MCG PO TABS: 100.0000 ug | ORAL_TABLET | Freq: Every day | ORAL | 0 refills | Status: DC

## 2019-06-08 NOTE — Telephone Encounter (Signed)
Medication Refill: levothyroxine (SYNTHROID) 100 MCG tablet [id number]    [facility] [place], [place] Phone:  [phone]  Fax:  [phone]      Pt would like to know if a 30 day supply could be sent to local pharmacy because he is completely out. Please advise .  [place], [place] Phone:  [phone]  Fax:  [phone]       Pt aware of turn around time

## 2019-06-21 DIAGNOSIS — Z08 Encounter for follow-up examination after completed treatment for malignant neoplasm: Secondary | ICD-10-CM | POA: Diagnosis not present

## 2019-06-21 DIAGNOSIS — Z85828 Personal history of other malignant neoplasm of skin: Secondary | ICD-10-CM | POA: Diagnosis not present

## 2019-07-05 ENCOUNTER — Telehealth: Payer: Self-pay

## 2019-07-05 ENCOUNTER — Telehealth: Payer: Self-pay | Admitting: Internal Medicine

## 2019-07-05 MED ORDER — CYANOCOBALAMIN 1000 MCG/ML IJ SOLN: 1000.0000 ug | INTRAMUSCULAR | 0 refills | Status: DC

## 2019-07-05 MED ORDER — CYANOCOBALAMIN 1000 MCG/ML IJ SOLN: 1000.0000 ug | INTRAMUSCULAR | 1 refills | Status: DC

## 2019-07-05 MED ORDER — "BD SYRINGE/NEEDLE 25G X 5/8"" 1 ML MISC": 0 refills | Status: DC

## 2019-07-05 NOTE — Telephone Encounter (Signed)
Megan from [place] is calling about rosuvastatin rosuvastatin (CRESTOR) 10 MG tablet  best contact [id number]

## 2019-07-05 NOTE — Telephone Encounter (Signed)
Called pt verified pharmacy info. Pt is wanting 1 sent to local som he can get this month, and 90 dya sent to mail order. Sent rx's to both pharmacy.Marland KitchenJohny Chess

## 2019-07-05 NOTE — Telephone Encounter (Signed)
Patient states Dr. Jenny Reichmann wants him to start on B12 injections.  Patient states his nurse at work would be able to give him these injections. Patient would like to know if a script could be sent to the pharmacy.

## 2019-07-06 MED ORDER — ROSUVASTATIN CALCIUM 10 MG PO TABS: 10.0000 mg | ORAL_TABLET | Freq: Every day | ORAL | 1 refills | Status: DC

## 2019-07-06 NOTE — Telephone Encounter (Signed)
Called [facility] spoke w/another pharmacist pt was needing refill on the crestor. Gave verbal to fill # 90 w/1 additional refill. Updated med list../lmb

## 2019-07-06 NOTE — Addendum Note (Signed)
Addended by: Earnstine Regal on: 07/06/2019 11:04 AM   Modules accepted: Orders

## 2019-07-15 ENCOUNTER — Encounter: Payer: Self-pay | Admitting: Internal Medicine

## 2019-07-15 ENCOUNTER — Other Ambulatory Visit: Payer: Self-pay

## 2019-07-15 ENCOUNTER — Ambulatory Visit: Payer: BC Managed Care – PPO | Admitting: Internal Medicine

## 2019-07-15 VITALS — BP 134/82 | HR 65 | Temp 98.6°F | Ht 69.0 in | Wt 234.0 lb

## 2019-07-15 DIAGNOSIS — M109 Gout, unspecified: Secondary | ICD-10-CM | POA: Diagnosis not present

## 2019-07-15 DIAGNOSIS — I10 Essential (primary) hypertension: Secondary | ICD-10-CM

## 2019-07-15 DIAGNOSIS — R7302 Impaired glucose tolerance (oral): Secondary | ICD-10-CM | POA: Diagnosis not present

## 2019-07-15 MED ORDER — METHYLPREDNISOLONE ACETATE 80 MG/ML IJ SUSP
80.0000 mg | Freq: Once | INTRAMUSCULAR | Status: AC
  Administered 2019-07-15: 80 mg via INTRAMUSCULAR

## 2019-07-15 MED ORDER — MITIGARE 0.6 MG PO CAPS: 1.0000 | ORAL_CAPSULE | Freq: Two times a day (BID) | ORAL | 3 refills | Status: DC

## 2019-07-15 MED ORDER — PREDNISONE 10 MG PO TABS: ORAL_TABLET | ORAL | 0 refills | Status: DC

## 2019-07-15 NOTE — Assessment & Plan Note (Signed)
stable overall by history and exam, recent data reviewed with pt, and pt to continue medical treatment as before,  to f/u any worsening symptoms or concerns le  

## 2019-07-15 NOTE — Progress Notes (Signed)
Subjective:    Patient ID: Luis Myers, male    DOB: 1945/12/18, 74 y.o.   MRN: [id number]  HPI  Here with 3 mo hx of recurrent gout attacks, most recently with left first MTP 3 days mod to severe pain and swelling without fever or trauma.  Has been allergy to allopurinol.  Good compliance with colchicine once daily.  Indocin does not always work well.  Pt denies chest pain, increased sob or doe, wheezing, orthopnea, PND, increased LE swelling, palpitations, dizziness or syncope.  Pt denies new neurological symptoms such as new headache, or facial or extremity weakness or numbness   Pt denies polydipsia, polyuria Past Medical History:  Diagnosis Date  . Acute gouty arthropathy 11/04/2008  . ALLERGIC RHINITIS 10/07/2007  . ANXIETY 03/04/2007  . BENIGN PROSTATIC HYPERTROPHY 03/04/2007  . CONJUNCTIVITIS, ACUTE, LEFT 11/27/2007  . GLUCOSE INTOLERANCE 04/23/2007  . GOUT 03/04/2007  . HYPERLIPIDEMIA 03/04/2007  . HYPERSOMNIA 02/20/2010  . HYPERTENSION 03/04/2007  . HYPOGONADISM 06/20/2010  . HYPOTHYROIDISM 03/04/2007  . Impaired fasting glucose 04/16/2007  . Impaired glucose tolerance 01/01/2011  . Morbid obesity ([place]) 03/04/2007  . PERIPHERAL EDEMA 11/04/2008  . Pure hypercholesterolemia 10/17/2008  . RASH-NONVESICULAR 07/25/2009  . SINUSITIS- ACUTE-NOS 05/16/2008  . URI 11/27/2007  . VITAMIN D DEFICIENCY 06/20/2010   Past Surgical History:  Procedure Laterality Date  . URETER SURGERY  1978   Stretched    reports that he has never smoked. He has never used smokeless tobacco. He reports that he does not drink alcohol or use drugs. family history includes Diabetes in an other family member; Heart disease in his brother and father; Lung cancer in his father. Allergies  Allergen Reactions  . Atorvastatin Other (See Comments)    Muscle pain  . Niacin Other (See Comments)    Tachycardia rate 160 beats / minute  . Simvastatin Other (See Comments)    Muscle pain  . Allopurinol Rash   Current  Outpatient Medications on File Prior to Visit  Medication Sig Dispense Refill  . cyanocobalamin (,VITAMIN B-12,) 1000 MCG/ML injection Inject 1 mL (1,000 mcg total) into the muscle every 30 (thirty) days. 3 mL 1  . finasteride (PROSCAR) 5 MG tablet TAKE 1 TABLET BY MOUTH DAILY GENERIC EQUIVALENT FOR PROSCAR 90 tablet 3  . indomethacin (INDOCIN) 50 MG capsule Take 1 capsule (50 mg total) by mouth 3 (three) times daily as needed. 90 capsule 2  . levothyroxine (SYNTHROID) 100 MCG tablet Take 1 tablet (100 mcg total) by mouth daily. 90 tablet 0  . lisinopril (ZESTRIL) 40 MG tablet TAKE 1 TABLET BY MOUTH DAILY 90 tablet 0  . rosuvastatin (CRESTOR) 10 MG tablet Take 1 tablet (10 mg total) by mouth daily. 90 tablet 1  . SYRINGE/NEEDLE, DISP, 1 ML (B-D SYRINGE/NEEDLE 1CC/25GX5/8) 25G X 5/8" 1 ML MISC Use to inject vitamin b12 monthly 12 each 0  . tamsulosin (FLOMAX) 0.4 MG CAPS capsule TAKE ONE CAPSULE BY MOUTH DAILY 90 capsule 1  . Vitamin D, Ergocalciferol, (DRISDOL) 1.25 MG (50000 UT) CAPS capsule Take 1 capsule (50,000 Units total) by mouth every 7 (seven) days. (Patient not taking: Reported on 07/15/2019) 12 capsule 0   No current facility-administered medications on file prior to visit.   Review of Systems All otherwise neg per pt     Objective:   Physical Exam BP 134/82   Pulse 65   Temp 98.6 F (37 C)   Ht 5\' 9"  (1.753 m)   Wt 234 lb (106.1  kg)   SpO2 100%   BMI 34.56 kg/m  VS noted,  Constitutional: Pt appears in NAD HENT: Head: NCAT.  Right Ear: External ear normal.  Left Ear: External ear normal.  Eyes: . Pupils are equal, round, and reactive to light. Conjunctivae and EOM are normal Nose: without d/c or deformity Neck: Neck supple. Gross normal ROM Cardiovascular: Normal rate and regular rhythm.   Pulmonary/Chest: Effort normal and breath sounds without rales or wheezing.  Abd:  Soft, NT, ND, + BS, no organomegaly Neurological: Pt is alert. At baseline orientation, motor  grossly intact Skin: Skin is warm. No rashes, other new lesions, no LE edema Left fitst MTP with 1+ red, tedner, swelling Psychiatric: Pt behavior is normal without agitation  All otherwise neg per pt Lab Results  Component Value Date   WBC 5.3 01/06/2019   HGB 14.4 01/06/2019   HCT 43.5 01/06/2019   PLT 143.0 (L) 01/06/2019   GLUCOSE 117 (H) 01/06/2019   CHOL 144 09/02/2016   TRIG 230 (A) 09/02/2016   HDL 38 09/02/2016   LDLDIRECT 74.0 07/19/2014   LDLCALC 60 09/02/2016   ALT 12 01/06/2019   AST 13 01/06/2019   NA 141 01/06/2019   K 4.6 01/06/2019   CL 104 01/06/2019   CREATININE 1.30 01/06/2019   BUN 16 01/06/2019   CO2 29 01/06/2019   TSH 0.99 01/06/2019   PSA 0.12 01/06/2019   HGBA1C 5.9 09/02/2016   MICROALBUR <0.7 07/19/2014         Assessment & Plan:

## 2019-07-15 NOTE — Patient Instructions (Signed)
You had the depomedrol steroid shot today  Please take all new medication as prescribed - the prednisone  Ok to increase the colchicine to 1 pill twice per day for prevention  Please continue all other medications as before, including the indocin as needed for future flares  Please have the pharmacy call with any other refills you may need.  Please continue your efforts at being more active, low cholesterol diet, and weight control.  Please keep your appointments with your specialists as you may have planned

## 2019-07-15 NOTE — Assessment & Plan Note (Signed)
stable overall by history and exam, recent data reviewed with pt, and pt to continue medical treatment as before,  to f/u any worsening symptoms or concerns  

## 2019-07-15 NOTE — Assessment & Plan Note (Addendum)
Recurrent flares x 3 mo now to left first MTP, for depomdrol IM 80, predpac asd, increased colchicine bid, and contd indocin tid prn,  to f/u any worsening symptoms or concerns  I spent 34 minutes preparing to see the patient by review of recent labs, imaging and procedures, obtaining and reviewing separately obtained history, communicating with the patient and family or caregiver, ordering medications, tests or procedures, and documenting clinical information in the EHR including the differential Dx, treatment, and any further evaluation and other management of acute gouty arthritis, hyperglycemia, Htn

## 2019-07-27 ENCOUNTER — Other Ambulatory Visit: Payer: Self-pay

## 2019-07-27 MED ORDER — TAMSULOSIN HCL 0.4 MG PO CAPS: 0.4000 mg | ORAL_CAPSULE | Freq: Every day | ORAL | 1 refills | Status: DC

## 2019-08-02 ENCOUNTER — Telehealth: Payer: Self-pay | Admitting: Internal Medicine

## 2019-08-02 MED ORDER — LISINOPRIL 40 MG PO TABS: 40.0000 mg | ORAL_TABLET | Freq: Every day | ORAL | 3 refills | Status: DC

## 2019-08-02 MED ORDER — LEVOTHYROXINE SODIUM 100 MCG PO TABS: 100.0000 ug | ORAL_TABLET | Freq: Every day | ORAL | 3 refills | Status: DC

## 2019-08-02 NOTE — Telephone Encounter (Signed)
Last refill on Lisinopril 40 mg was 12/25/2018, 90 tablets with no refill.  Is it okay for patient to receive another refill?   Please advise

## 2019-08-02 NOTE — Telephone Encounter (Signed)
      1. Which medications need to be refilled? (please list name of each medication and dose if known)  levothyroxine (SYNTHROID) 100 MCG tablet lisinopril (ZESTRIL) 40 MG tablet  2. Which pharmacy/location (including street and city if local pharmacy) is medication to be sent to? [facility] - [place], [place] - [address] AT [place]  3. Do they need a 30 day or 90 day supply? [place]

## 2019-08-04 ENCOUNTER — Other Ambulatory Visit: Payer: Self-pay | Admitting: Internal Medicine

## 2019-08-12 ENCOUNTER — Ambulatory Visit: Payer: BC Managed Care – PPO | Attending: Internal Medicine

## 2019-08-12 DIAGNOSIS — Z23 Encounter for immunization: Secondary | ICD-10-CM

## 2019-08-12 NOTE — Progress Notes (Signed)
   Covid-19 Vaccination Clinic  Name:  Ferris Valen    MRN: [id number] DOB: 05-20-1946  08/12/2019  Mr. Daniels was observed post Covid-19 immunization for 15 minutes without incident. He was provided with Vaccine Information Sheet and instruction to access the V-Safe system.   Mr. Liva was instructed to call 911 with any severe reactions post vaccine: Marland Kitchen Difficulty breathing  . Swelling of face and throat  . A fast heartbeat  . A bad rash all over body  . Dizziness and weakness   Immunizations Administered    Name Date Dose VIS Date Route   Pfizer COVID-19 Vaccine 08/12/2019  4:00 PM 0.3 mL 05/21/2019 Intramuscular   Manufacturer: [place]   Lot: [id number]   [place]: [id number]

## 2019-09-14 ENCOUNTER — Ambulatory Visit: Payer: BC Managed Care – PPO | Attending: Internal Medicine

## 2019-09-14 DIAGNOSIS — Z23 Encounter for immunization: Secondary | ICD-10-CM

## 2019-09-14 NOTE — Progress Notes (Signed)
   Covid-19 Vaccination Clinic  Name:  Luis Myers    MRN: [id number] DOB: 03-22-1946  09/14/2019  [name] was observed post Covid-19 immunization for 15 minutes without incident. He was provided with Vaccine Information Sheet and instruction to access the V-Safe system.   [name] was instructed to call 911 with any severe reactions post vaccine: Marland Kitchen Difficulty breathing  . Swelling of face and throat  . A fast heartbeat  . A bad rash all over body  . Dizziness and weakness   Immunizations Administered    Name Date Dose VIS Date Route   Pfizer COVID-19 Vaccine 09/14/2019  3:09 PM 0.3 mL 05/21/2019 Intramuscular   Manufacturer: [employer], [employer]   Lot: [id number]   [place]: [id number]

## 2019-12-02 ENCOUNTER — Telehealth: Payer: Self-pay | Admitting: Internal Medicine

## 2019-12-02 DIAGNOSIS — M109 Gout, unspecified: Secondary | ICD-10-CM

## 2019-12-02 DIAGNOSIS — E039 Hypothyroidism, unspecified: Secondary | ICD-10-CM

## 2019-12-02 DIAGNOSIS — R7302 Impaired glucose tolerance (oral): Secondary | ICD-10-CM

## 2019-12-02 DIAGNOSIS — Z0000 Encounter for general adult medical examination without abnormal findings: Secondary | ICD-10-CM

## 2019-12-02 DIAGNOSIS — E559 Vitamin D deficiency, unspecified: Secondary | ICD-10-CM

## 2019-12-02 DIAGNOSIS — E538 Deficiency of other specified B group vitamins: Secondary | ICD-10-CM

## 2019-12-02 DIAGNOSIS — I10 Essential (primary) hypertension: Secondary | ICD-10-CM

## 2019-12-02 NOTE — Telephone Encounter (Signed)
Pt scheduled physical and is requesting labs before appointment.  Physical 02/01/2020 Labs 01/26/2020

## 2019-12-02 NOTE — Telephone Encounter (Signed)
Pt got new insurance they are not paying for the Mitigare 0.6 MG. Wondering if there is something else that can be sent in.  [facility] (MAIL [facility]) [facility] [place], [place]

## 2019-12-03 MED ORDER — COLCHICINE 0.6 MG PO TABS: 0.6000 mg | ORAL_TABLET | Freq: Every day | ORAL | 3 refills | Status: DC

## 2019-12-03 NOTE — Telephone Encounter (Signed)
[place] for generic - done erx

## 2019-12-03 NOTE — Telephone Encounter (Signed)
Left detailed message for pt informing generic rx has been sent in. This may not be covered either, pt will call and let us know if there is any issure.

## 2019-12-03 NOTE — Telephone Encounter (Signed)
Labs orders done.

## 2019-12-14 ENCOUNTER — Telehealth: Payer: Self-pay | Admitting: Internal Medicine

## 2019-12-14 NOTE — Telephone Encounter (Signed)
Patient has changed pharmacy and needs all his medication sent to [facility] on file.

## 2019-12-15 ENCOUNTER — Other Ambulatory Visit: Payer: Self-pay

## 2019-12-15 MED ORDER — LISINOPRIL 40 MG PO TABS: 40.0000 mg | ORAL_TABLET | Freq: Every day | ORAL | 3 refills | Status: DC

## 2019-12-15 MED ORDER — LEVOTHYROXINE SODIUM 100 MCG PO TABS: 100.0000 ug | ORAL_TABLET | Freq: Every day | ORAL | 3 refills | Status: DC

## 2019-12-15 MED ORDER — COLCHICINE 0.6 MG PO TABS: 0.6000 mg | ORAL_TABLET | Freq: Every day | ORAL | 3 refills | Status: DC

## 2019-12-15 MED ORDER — TAMSULOSIN HCL 0.4 MG PO CAPS: 0.4000 mg | ORAL_CAPSULE | Freq: Every day | ORAL | 1 refills | Status: DC

## 2019-12-15 MED ORDER — FINASTERIDE 5 MG PO TABS: ORAL_TABLET | ORAL | 3 refills | Status: DC

## 2019-12-15 MED ORDER — ROSUVASTATIN CALCIUM 10 MG PO TABS: 10.0000 mg | ORAL_TABLET | Freq: Every day | ORAL | 1 refills | Status: DC

## 2019-12-15 NOTE — Telephone Encounter (Signed)
Medications have been sent over to [facility] as requested by pt.

## 2020-01-06 ENCOUNTER — Other Ambulatory Visit: Payer: Self-pay

## 2020-01-06 MED ORDER — ROSUVASTATIN CALCIUM 10 MG PO TABS: 10.0000 mg | ORAL_TABLET | Freq: Every day | ORAL | 1 refills | Status: DC

## 2020-01-06 MED ORDER — FINASTERIDE 5 MG PO TABS: ORAL_TABLET | ORAL | 3 refills | Status: DC

## 2020-01-10 ENCOUNTER — Other Ambulatory Visit: Payer: Self-pay

## 2020-01-10 NOTE — Telephone Encounter (Signed)
1.Medication Requested:finasteride (PROSCAR) 5 MG tablet  2. Pharmacy (Name, Street, [place]):[place] [address] [place] [facility] [employer] [facility], [address]  3. On Med List: Yes   4. Last Visit with PCP: 2.4.21   5. Next visit date with PCP: 8.24.21    Agent: Please be advised that RX refills may take up to 3 business days. We ask that you follow-up with your pharmacy.

## 2020-01-18 ENCOUNTER — Other Ambulatory Visit: Payer: Self-pay

## 2020-01-18 MED ORDER — FINASTERIDE 5 MG PO TABS: 5.0000 mg | ORAL_TABLET | Freq: Every day | ORAL | 3 refills | Status: DC

## 2020-01-18 NOTE — Telephone Encounter (Signed)
LVM to inform pt that medication has been refilled at the requested pharmacy.

## 2020-01-18 NOTE — Telephone Encounter (Signed)
F/u    The patient calling checking on the status of medication and why it's not been refill - out of medication on yesterday.

## 2020-01-26 ENCOUNTER — Other Ambulatory Visit: Payer: BC Managed Care – PPO

## 2020-01-26 ENCOUNTER — Other Ambulatory Visit (INDEPENDENT_AMBULATORY_CARE_PROVIDER_SITE_OTHER): Payer: Medicare Other

## 2020-01-26 DIAGNOSIS — E559 Vitamin D deficiency, unspecified: Secondary | ICD-10-CM | POA: Diagnosis not present

## 2020-01-26 DIAGNOSIS — Z0000 Encounter for general adult medical examination without abnormal findings: Secondary | ICD-10-CM | POA: Diagnosis not present

## 2020-01-26 DIAGNOSIS — R7302 Impaired glucose tolerance (oral): Secondary | ICD-10-CM | POA: Diagnosis not present

## 2020-01-26 DIAGNOSIS — E538 Deficiency of other specified B group vitamins: Secondary | ICD-10-CM

## 2020-01-26 DIAGNOSIS — M109 Gout, unspecified: Secondary | ICD-10-CM | POA: Diagnosis not present

## 2020-01-26 LAB — BASIC METABOLIC PANEL
BUN: 15 mg/dL (ref 6–23)
CO2: 26 mEq/L (ref 19–32)
Calcium: 9.8 mg/dL (ref 8.4–10.5)
Chloride: 107 mEq/L (ref 96–112)
Creatinine, Ser: 1.42 mg/dL (ref 0.40–1.50)
GFR: 49 mL/min — ABNORMAL LOW (ref >60.00)
Glucose, Bld: 120 mg/dL — ABNORMAL HIGH (ref 70–99)
Potassium: 4.6 mEq/L (ref 3.5–5.1)
Sodium: 141 mEq/L (ref 135–145)

## 2020-01-26 LAB — URINALYSIS, ROUTINE W REFLEX MICROSCOPIC
Bilirubin Urine: NEGATIVE
Hgb urine dipstick: NEGATIVE
Ketones, ur: NEGATIVE
Leukocytes,Ua: NEGATIVE
Nitrite: NEGATIVE
RBC / HPF: NONE SEEN (ref 0–?)
Specific Gravity, Urine: 1.02 (ref 1.000–1.030)
Total Protein, Urine: NEGATIVE
Urine Glucose: NEGATIVE
Urobilinogen, UA: 0.2 (ref 0.0–1.0)
pH: 5.5 (ref 5.0–8.0)

## 2020-01-26 LAB — CBC WITH DIFFERENTIAL/PLATELET
Basophils Absolute: 0 10*3/uL (ref 0.0–0.1)
Basophils Relative: 0.4 % (ref 0.0–3.0)
Eosinophils Absolute: 0.1 10*3/uL (ref 0.0–0.7)
Eosinophils Relative: 2.4 % (ref 0.0–5.0)
HCT: 42.1 % (ref 39.0–52.0)
Hemoglobin: 13.9 g/dL (ref 13.0–17.0)
Lymphocytes Relative: 31.8 % (ref 12.0–46.0)
Lymphs Abs: 1.8 10*3/uL (ref 0.7–4.0)
MCHC: 33.1 g/dL (ref 30.0–36.0)
MCV: 87.8 fl (ref 78.0–100.0)
Monocytes Absolute: 0.6 10*3/uL (ref 0.1–1.0)
Monocytes Relative: 10 % (ref 3.0–12.0)
Neutro Abs: 3.1 10*3/uL (ref 1.4–7.7)
Neutrophils Relative %: 55.4 % (ref 43.0–77.0)
Platelets: 138 10*3/uL — ABNORMAL LOW (ref 150.0–400.0)
RBC: 4.79 Mil/uL (ref 4.22–5.81)
RDW: 14.7 % (ref 11.5–15.5)
WBC: 5.6 10*3/uL (ref 4.0–10.5)

## 2020-01-26 LAB — HEPATIC FUNCTION PANEL
ALT: 12 U/L (ref 0–53)
AST: 13 U/L (ref 0–37)
Albumin: 4.4 g/dL (ref 3.5–5.2)
Alkaline Phosphatase: 61 U/L (ref 39–117)
Bilirubin, Direct: 0.1 mg/dL (ref 0.0–0.3)
Total Bilirubin: 0.8 mg/dL (ref 0.2–1.2)
Total Protein: 7.2 g/dL (ref 6.0–8.3)

## 2020-01-26 LAB — LIPID PANEL
Cholesterol: 131 mg/dL (ref 0–200)
HDL: 37 mg/dL — ABNORMAL LOW (ref >39.00)
LDL Cholesterol: 58 mg/dL (ref 0–99)
NonHDL: 93.64
Total CHOL/HDL Ratio: 4
Triglycerides: 176 mg/dL — ABNORMAL HIGH (ref 0.0–149.0)
VLDL: 35.2 mg/dL (ref 0.0–40.0)

## 2020-01-26 LAB — VITAMIN B12: Vitamin B-12: 197 pg/mL — ABNORMAL LOW (ref 211–911)

## 2020-01-26 LAB — URIC ACID: Uric Acid, Serum: 8.8 mg/dL — ABNORMAL HIGH (ref 4.0–7.8)

## 2020-01-26 LAB — PSA: PSA: 0.18 ng/mL (ref 0.10–4.00)

## 2020-01-26 LAB — HEMOGLOBIN A1C: Hgb A1c MFr Bld: 6.1 % (ref 4.6–6.5)

## 2020-01-26 LAB — TSH: TSH: 0.47 u[IU]/mL (ref 0.35–4.50)

## 2020-01-26 LAB — VITAMIN D 25 HYDROXY (VIT D DEFICIENCY, FRACTURES): VITD: 15.44 ng/mL — ABNORMAL LOW (ref 30.00–100.00)

## 2020-02-01 ENCOUNTER — Encounter: Payer: BC Managed Care – PPO | Admitting: Internal Medicine

## 2020-02-21 DIAGNOSIS — Z08 Encounter for follow-up examination after completed treatment for malignant neoplasm: Secondary | ICD-10-CM | POA: Diagnosis not present

## 2020-02-21 DIAGNOSIS — L570 Actinic keratosis: Secondary | ICD-10-CM | POA: Diagnosis not present

## 2020-02-21 DIAGNOSIS — X32XXXD Exposure to sunlight, subsequent encounter: Secondary | ICD-10-CM | POA: Diagnosis not present

## 2020-02-21 DIAGNOSIS — D225 Melanocytic nevi of trunk: Secondary | ICD-10-CM | POA: Diagnosis not present

## 2020-02-21 DIAGNOSIS — B078 Other viral warts: Secondary | ICD-10-CM | POA: Diagnosis not present

## 2020-02-21 DIAGNOSIS — Z85828 Personal history of other malignant neoplasm of skin: Secondary | ICD-10-CM | POA: Diagnosis not present

## 2020-02-23 ENCOUNTER — Other Ambulatory Visit: Payer: Self-pay

## 2020-02-23 ENCOUNTER — Encounter: Payer: Self-pay | Admitting: Internal Medicine

## 2020-02-23 ENCOUNTER — Ambulatory Visit (INDEPENDENT_AMBULATORY_CARE_PROVIDER_SITE_OTHER): Payer: Medicare Other | Admitting: Internal Medicine

## 2020-02-23 VITALS — BP 130/80 | HR 82 | Temp 98.4°F | Ht 69.0 in | Wt 227.0 lb

## 2020-02-23 DIAGNOSIS — Z0000 Encounter for general adult medical examination without abnormal findings: Secondary | ICD-10-CM

## 2020-02-23 DIAGNOSIS — R7302 Impaired glucose tolerance (oral): Secondary | ICD-10-CM

## 2020-02-23 DIAGNOSIS — E559 Vitamin D deficiency, unspecified: Secondary | ICD-10-CM | POA: Diagnosis not present

## 2020-02-23 DIAGNOSIS — Z23 Encounter for immunization: Secondary | ICD-10-CM

## 2020-02-23 DIAGNOSIS — E538 Deficiency of other specified B group vitamins: Secondary | ICD-10-CM | POA: Diagnosis not present

## 2020-02-23 DIAGNOSIS — M109 Gout, unspecified: Secondary | ICD-10-CM

## 2020-02-23 MED ORDER — VITAMIN B-12 1000 MCG PO TABS: 1000.0000 ug | ORAL_TABLET | Freq: Every day | ORAL | 3 refills | Status: DC

## 2020-02-23 MED ORDER — VITAMIN D 50 MCG (2000 UT) PO TABS: 2000.0000 [IU] | ORAL_TABLET | Freq: Every day | ORAL | 3 refills | Status: AC

## 2020-02-23 MED ORDER — FINASTERIDE 5 MG PO TABS: 5.0000 mg | ORAL_TABLET | Freq: Every day | ORAL | 3 refills | Status: DC

## 2020-02-23 NOTE — Patient Instructions (Addendum)
Please try to do without the colchicine for now, but call if you change your mind.  You had the flu shot today  Please take all new medication as prescribed - the pill form of Vit B12, and D3 2000 units  Please continue all other medications as before, and refills have been done if requested.  Please have the pharmacy call with any other refills you may need.  Please continue your efforts at being more active, low cholesterol diet, and weight control.  You are otherwise up to date with prevention measures today.  Please keep your appointments with your specialists as you may have planned  Please make an Appointment to return for your 1 year visit, or sooner if needed, with Lab testing by Appointment as well, to be done about 3-5 days before at the [facility] (so this is for TWO appointments - please see the scheduling desk as you leave)

## 2020-02-23 NOTE — Assessment & Plan Note (Signed)
For vit d 2000 u

## 2020-02-23 NOTE — Assessment & Plan Note (Signed)
For oral replacement 

## 2020-02-23 NOTE — Assessment & Plan Note (Signed)

## 2020-02-23 NOTE — Assessment & Plan Note (Signed)
stable overall by history and exam, recent data reviewed with pt, and pt to continue medical treatment as before,  to f/u any worsening symptoms or concerns  

## 2020-02-23 NOTE — Progress Notes (Signed)
Subjective:    Patient ID: Luis Myers, male    DOB: September 02, 1945, 74 y.o.   MRN: [id number]  HPI  Here for wellness and f/u;  Overall doing ok;  Pt denies Chest pain, worsening SOB, DOE, wheezing, orthopnea, PND, worsening LE edema, palpitations, dizziness or syncope.  Pt denies neurological change such as new headache, facial or extremity weakness.  Pt denies polydipsia, polyuria, or low sugar symptoms. Pt states overall good compliance with treatment and medications, good tolerability, and has been trying to follow appropriate diet.  Pt denies worsening depressive symptoms, suicidal ideation or panic. No fever, night sweats, wt loss, loss of appetite, or other constitutional symptoms.  Pt states good ability with ADL's, has low fall risk, home safety reviewed and adequate, no other significant changes in hearing or vision, and only occasionally active with exercise. Near completely retired.  Lost wt with better activity. Wt Readings from Last 3 Encounters:  02/23/20 227 lb (103 kg)  07/15/19 234 lb (106.1 kg)  01/06/19 234 lb (106.1 kg)   Past Medical History:  Diagnosis Date  . Acute gouty arthropathy 11/04/2008  . ALLERGIC RHINITIS 10/07/2007  . ANXIETY 03/04/2007  . BENIGN PROSTATIC HYPERTROPHY 03/04/2007  . CONJUNCTIVITIS, ACUTE, LEFT 11/27/2007  . GLUCOSE INTOLERANCE 04/23/2007  . GOUT 03/04/2007  . HYPERLIPIDEMIA 03/04/2007  . HYPERSOMNIA 02/20/2010  . HYPERTENSION 03/04/2007  . HYPOGONADISM 06/20/2010  . HYPOTHYROIDISM 03/04/2007  . Impaired fasting glucose 04/16/2007  . Impaired glucose tolerance 01/01/2011  . Morbid obesity ([place]) 03/04/2007  . PERIPHERAL EDEMA 11/04/2008  . Pure hypercholesterolemia 10/17/2008  . RASH-NONVESICULAR 07/25/2009  . SINUSITIS- ACUTE-NOS 05/16/2008  . URI 11/27/2007  . VITAMIN D DEFICIENCY 06/20/2010   Past Surgical History:  Procedure Laterality Date  . URETER SURGERY  1978   Stretched    reports that he has never smoked. He has never used smokeless  tobacco. He reports that he does not drink alcohol and does not use drugs. family history includes Diabetes in an other family member; Heart disease in his brother and father; Lung cancer in his father. Allergies  Allergen Reactions  . Atorvastatin Other (See Comments)    Muscle pain  . Niacin Other (See Comments)    Tachycardia rate 160 beats / minute  . Simvastatin Other (See Comments)    Muscle pain  . Allopurinol Rash   Current Outpatient Medications on File Prior to Visit  Medication Sig Dispense Refill  . indomethacin (INDOCIN) 50 MG capsule Take 1 capsule (50 mg total) by mouth 3 (three) times daily as needed. 90 capsule 2  . levothyroxine (SYNTHROID) 100 MCG tablet Take 1 tablet (100 mcg total) by mouth daily. 90 tablet 3  . lisinopril (ZESTRIL) 40 MG tablet Take 1 tablet (40 mg total) by mouth daily. 90 tablet 3  . predniSONE (DELTASONE) 10 MG tablet 3 tabs by mouth per day for 3 days,2tabs per day for 3 days,1tab per day for 3 days 18 tablet 0  . rosuvastatin (CRESTOR) 10 MG tablet Take 1 tablet (10 mg total) by mouth daily. 90 tablet 1  . SYRINGE/NEEDLE, DISP, 1 ML (B-D SYRINGE/NEEDLE 1CC/25GX5/8) 25G X 5/8" 1 ML MISC Use to inject vitamin b12 monthly 12 each 0  . tamsulosin (FLOMAX) 0.4 MG CAPS capsule Take 1 capsule (0.4 mg total) by mouth daily. 90 capsule 1   No current facility-administered medications on file prior to visit.   Review of Systems All otherwise neg per pt    Objective:   Physical Exam  BP 130/80 (BP Location: Left Arm, Patient Position: Sitting, Cuff Size: Large)   Pulse 82   Temp 98.4 F (36.9 C) (Oral)   Ht 5\' 9"  (1.753 m)   Wt 227 lb (103 kg)   SpO2 96%   BMI 33.52 kg/m  VS noted,  Constitutional: Pt appears in NAD HENT: Head: NCAT.  Right Ear: External ear normal.  Left Ear: External ear normal.  Eyes: . Pupils are equal, round, and reactive to light. Conjunctivae and EOM are normal Nose: without d/c or deformity Neck: Neck supple. Gross  normal ROM Cardiovascular: Normal rate and regular rhythm.   Pulmonary/Chest: Effort normal and breath sounds without rales or wheezing.  Abd:  Soft, NT, ND, + BS, no organomegaly Neurological: Pt is alert. At baseline orientation, motor grossly intact Skin: Skin is warm. No rashes, other new lesions, no LE edema Psychiatric: Pt behavior is normal without agitation  All otherwise neg per pt Lab Results  Component Value Date   WBC 5.6 01/26/2020   HGB 13.9 01/26/2020   HCT 42.1 01/26/2020   PLT 138.0 (L) 01/26/2020   GLUCOSE 120 (H) 01/26/2020   CHOL 131 01/26/2020   TRIG 176.0 (H) 01/26/2020   HDL 37.00 (L) 01/26/2020   LDLDIRECT 74.0 07/19/2014   LDLCALC 58 01/26/2020   ALT 12 01/26/2020   AST 13 01/26/2020   NA 141 01/26/2020   K 4.6 01/26/2020   CL 107 01/26/2020   CREATININE 1.42 01/26/2020   BUN 15 01/26/2020   CO2 26 01/26/2020   TSH 0.47 01/26/2020   PSA 0.18 01/26/2020   HGBA1C 6.1 01/26/2020   MICROALBUR <0.7 07/19/2014      Assessment & Plan:

## 2020-03-20 ENCOUNTER — Telehealth: Payer: Self-pay | Admitting: Emergency Medicine

## 2020-03-20 ENCOUNTER — Other Ambulatory Visit: Payer: Self-pay

## 2020-03-20 MED ORDER — ROSUVASTATIN CALCIUM 10 MG PO TABS
10.0000 mg | ORAL_TABLET | Freq: Every day | ORAL | 1 refills | Status: DC
Start: 2020-03-20 — End: 2020-03-20

## 2020-03-20 MED ORDER — ROSUVASTATIN CALCIUM 10 MG PO TABS
10.0000 mg | ORAL_TABLET | Freq: Every day | ORAL | 1 refills | Status: DC
Start: 2020-03-20 — End: 2020-03-28

## 2020-03-20 NOTE — Telephone Encounter (Signed)
Pt called requesting a refill on his rosuvastatin (CRESTOR) 10 MG tablet. Pharmacy is [facility]. Please let patient know when this has been called in. Thanks.

## 2020-03-20 NOTE — Telephone Encounter (Signed)
Medication has been sent to [facility] as requested by pt.

## 2020-03-28 ENCOUNTER — Other Ambulatory Visit: Payer: Self-pay

## 2020-03-28 MED ORDER — ROSUVASTATIN CALCIUM 10 MG PO TABS
10.0000 mg | ORAL_TABLET | Freq: Every day | ORAL | 1 refills | Status: DC
Start: 2020-03-28 — End: 2020-03-31

## 2020-03-28 NOTE — Telephone Encounter (Signed)
   Patient calling to request to 30 day supply of rosuvastatin (CRESTOR) 10 MG tablet be sent to local  Pharmacy on file; [facility]. He has no medication remaining. Please also re- send to [facility]

## 2020-03-28 NOTE — Telephone Encounter (Signed)
Sent to pts pharmacy 

## 2020-03-28 NOTE — Telephone Encounter (Signed)
Patient called and said that [facility] did not receive the medication request. He was wondering if the request could be sent again. rosuvastatin (CRESTOR) 10 MG tablet

## 2020-03-31 ENCOUNTER — Other Ambulatory Visit: Payer: Self-pay | Admitting: Internal Medicine

## 2020-03-31 MED ORDER — ROSUVASTATIN CALCIUM 10 MG PO TABS
10.0000 mg | ORAL_TABLET | Freq: Every day | ORAL | 3 refills | Status: DC
Start: 2020-03-31 — End: 2021-02-07

## 2020-04-24 ENCOUNTER — Other Ambulatory Visit: Payer: Self-pay | Admitting: Internal Medicine

## 2020-04-25 NOTE — Telephone Encounter (Signed)
Please refill as per office routine med refill policy (all routine meds refilled for 3 mo or monthly per pt preference up to one year from last visit, then month to month grace period for 3 mo, then further med refills will have to be denied)  

## 2020-04-27 NOTE — Telephone Encounter (Signed)
Please refill as per office routine med refill policy (all routine meds refilled for 3 mo or monthly per pt preference up to one year from last visit, then month to month grace period for 3 mo, then further med refills will have to be denied)  

## 2020-10-12 ENCOUNTER — Other Ambulatory Visit: Payer: Self-pay | Admitting: Internal Medicine

## 2020-10-20 ENCOUNTER — Ambulatory Visit: Payer: Self-pay

## 2020-10-20 ENCOUNTER — Encounter: Payer: Self-pay | Admitting: Family Medicine

## 2020-10-20 ENCOUNTER — Other Ambulatory Visit: Payer: Self-pay

## 2020-10-20 ENCOUNTER — Ambulatory Visit: Payer: Medicare Other | Admitting: Family Medicine

## 2020-10-20 VITALS — BP 128/78 | HR 70 | Ht 69.0 in | Wt 223.4 lb

## 2020-10-20 DIAGNOSIS — S56912A Strain of unspecified muscles, fascia and tendons at forearm level, left arm, initial encounter: Secondary | ICD-10-CM

## 2020-10-20 DIAGNOSIS — M25522 Pain in left elbow: Secondary | ICD-10-CM

## 2020-10-20 MED ORDER — TRAMADOL HCL 50 MG PO TABS: 50.0000 mg | ORAL_TABLET | Freq: Three times a day (TID) | ORAL | 0 refills | Status: DC | PRN

## 2020-10-20 NOTE — Progress Notes (Signed)
   I, Luis Myers, LAT, ATC, am serving as scribe for Dr. Lynne Leader.  Subjective:    CC: Left elbow pain  HPI: Pt is a 75 y/o male c/o L elbow pain since yesterday when he was removing a box from an overhead shelf that was heavier than he expected, forcing his L elbow into extension. MOI: Pt reports he "hyperextended" elbow. Pt was previously seen by Dr. Tamala Julian in 2018 for L heel pain. Today, pt locates pain to his medial epicondyle and volar forearm.  Swelling: yes in his elbow, forearm and hand Aggravating factors: L elbow extension: L forearm pronation w/ hand flexion Treatments tried: ice; Advil;     Pertinent review of Systems: No fevers or chills  Relevant historical information: Hypertension   Objective:    Vitals:   10/20/20 1027  BP: 128/78  Pulse: 70  SpO2: 97%   General: Well Developed, well nourished, and in no acute distress.   MSK: Left elbow normal-appearing normal motion.  Tender palpation at medial epicondyle.  Nontender lateral epicondyle and olecranon. Range of motion elbow limited lacking full extension.  Pain with full supination.  Strength is intact to flexion, supination and extension without pain Pain and slightly limited strength to pronation Pain reproduced with resisted wrist and finger flexion.  Wrist and finger extension nonpainful and intact. Pulses and cap refill and sensation are intact distally.    Impression and Recommendations:    Assessment and Plan: 75 y.o. male with extension injury to the volar forearm.  No significant bruising visible today and strength is intact but painful.  Patient had a strain of the forearm musculature.  Doubtful for severe tear or bony injury.. Plan for hand PT and home exercise program.  Also recommend wrist brace.  Tramadol for severe pain if needed.  PDMP reviewed during this encounter. Orders Placed This Encounter  Procedures  . Ambulatory referral to Physical Therapy    Referral Priority:    Routine    Referral Type:   Physical Medicine    Referral Reason:   Specialty Services Required    Requested Specialty:   Physical Therapy    Number of Visits Requested:   1   Meds ordered this encounter  Medications  . traMADol (ULTRAM) 50 MG tablet    Sig: Take 1 tablet (50 mg total) by mouth every 8 (eight) hours as needed for severe pain.    Dispense:  10 tablet    Refill:  0    Discussed warning signs or symptoms. Please see discharge instructions. Patient expresses understanding.   The above documentation has been reviewed and is accurate and complete Lynne Leader, M.D.

## 2020-10-20 NOTE — Patient Instructions (Addendum)
Thank you for coming in today.  Please go to [facility] supply to get the wrist brace we talked about today. You may also be able to get it from [facility].   Use over the counter medicine for pain as needed.  Ok to take both with tramadol if you have severe pain.  Start the home exercises when you are able. View at [email or web address] using code: [email or web address]  Recheck in about 1 month.   Let me know if this is not going well. We can do more.

## 2020-10-26 DIAGNOSIS — R531 Weakness: Secondary | ICD-10-CM | POA: Diagnosis not present

## 2020-10-26 DIAGNOSIS — M25522 Pain in left elbow: Secondary | ICD-10-CM | POA: Diagnosis not present

## 2020-10-26 DIAGNOSIS — M25622 Stiffness of left elbow, not elsewhere classified: Secondary | ICD-10-CM | POA: Diagnosis not present

## 2020-10-26 DIAGNOSIS — S66912D Strain of unspecified muscle, fascia and tendon at wrist and hand level, left hand, subsequent encounter: Secondary | ICD-10-CM | POA: Diagnosis not present

## 2020-10-31 DIAGNOSIS — R531 Weakness: Secondary | ICD-10-CM | POA: Diagnosis not present

## 2020-10-31 DIAGNOSIS — S66912D Strain of unspecified muscle, fascia and tendon at wrist and hand level, left hand, subsequent encounter: Secondary | ICD-10-CM | POA: Diagnosis not present

## 2020-10-31 DIAGNOSIS — M25522 Pain in left elbow: Secondary | ICD-10-CM | POA: Diagnosis not present

## 2020-10-31 DIAGNOSIS — M25622 Stiffness of left elbow, not elsewhere classified: Secondary | ICD-10-CM | POA: Diagnosis not present

## 2020-11-13 ENCOUNTER — Other Ambulatory Visit: Payer: Self-pay | Admitting: Internal Medicine

## 2021-01-31 ENCOUNTER — Telehealth: Payer: Self-pay

## 2021-02-05 ENCOUNTER — Telehealth: Payer: Self-pay

## 2021-02-05 NOTE — Telephone Encounter (Signed)
Patient called to check status of refill request  Also refill needs to go to correct pharmacy: [facility]  ([place], [place]  Phone:  [phone] Fax:  [phone]

## 2021-02-05 NOTE — Telephone Encounter (Signed)
[facility] refill request for colchicine received through E fax 8/22

## 2021-02-06 ENCOUNTER — Other Ambulatory Visit: Payer: Self-pay | Admitting: Internal Medicine

## 2021-02-06 MED ORDER — COLCHICINE 0.6 MG PO TABS: 0.6000 mg | ORAL_TABLET | Freq: Every day | ORAL | 0 refills | Status: DC

## 2021-02-06 NOTE — Addendum Note (Signed)
Addended by: Biagio Borg on: 02/06/2021 06:46 PM   Modules accepted: Orders

## 2021-02-06 NOTE — Telephone Encounter (Signed)
Medication is not seen on patient's med list; please advise

## 2021-02-07 ENCOUNTER — Other Ambulatory Visit: Payer: Self-pay

## 2021-02-07 DIAGNOSIS — M109 Gout, unspecified: Secondary | ICD-10-CM

## 2021-02-07 MED ORDER — COLCHICINE 0.6 MG PO TABS: 0.6000 mg | ORAL_TABLET | Freq: Every day | ORAL | 0 refills | Status: DC

## 2021-02-07 NOTE — Telephone Encounter (Signed)
Please refill as per office routine med refill policy (all routine meds to be refilled for 3 mo or monthly (per pt preference) up to one year from last visit, then month to month grace period for 3 mo, then further med refills will have to be denied) ? ?

## 2021-03-05 ENCOUNTER — Other Ambulatory Visit: Payer: Self-pay

## 2021-03-05 ENCOUNTER — Encounter: Payer: Self-pay | Admitting: Internal Medicine

## 2021-03-05 ENCOUNTER — Ambulatory Visit (INDEPENDENT_AMBULATORY_CARE_PROVIDER_SITE_OTHER): Payer: Medicare Other | Admitting: Internal Medicine

## 2021-03-05 VITALS — BP 124/66 | HR 60 | Temp 97.9°F | Resp 18 | Ht 69.0 in | Wt 230.2 lb

## 2021-03-05 DIAGNOSIS — E559 Vitamin D deficiency, unspecified: Secondary | ICD-10-CM | POA: Diagnosis not present

## 2021-03-05 DIAGNOSIS — E538 Deficiency of other specified B group vitamins: Secondary | ICD-10-CM | POA: Diagnosis not present

## 2021-03-05 DIAGNOSIS — R7302 Impaired glucose tolerance (oral): Secondary | ICD-10-CM | POA: Diagnosis not present

## 2021-03-05 DIAGNOSIS — E7800 Pure hypercholesterolemia, unspecified: Secondary | ICD-10-CM

## 2021-03-05 DIAGNOSIS — M109 Gout, unspecified: Secondary | ICD-10-CM

## 2021-03-05 DIAGNOSIS — Z125 Encounter for screening for malignant neoplasm of prostate: Secondary | ICD-10-CM | POA: Diagnosis not present

## 2021-03-05 DIAGNOSIS — I10 Essential (primary) hypertension: Secondary | ICD-10-CM

## 2021-03-05 DIAGNOSIS — Z23 Encounter for immunization: Secondary | ICD-10-CM

## 2021-03-05 DIAGNOSIS — Z0001 Encounter for general adult medical examination with abnormal findings: Secondary | ICD-10-CM | POA: Diagnosis not present

## 2021-03-05 LAB — VITAMIN B12: Vitamin B-12: 169 pg/mL — ABNORMAL LOW (ref 211–911)

## 2021-03-05 LAB — CBC WITH DIFFERENTIAL/PLATELET
Basophils Absolute: 0 10*3/uL (ref 0.0–0.1)
Basophils Relative: 0.6 % (ref 0.0–3.0)
Eosinophils Absolute: 0.1 10*3/uL (ref 0.0–0.7)
Eosinophils Relative: 1.5 % (ref 0.0–5.0)
HCT: 43.2 % (ref 39.0–52.0)
Hemoglobin: 14.4 g/dL (ref 13.0–17.0)
Lymphocytes Relative: 27.6 % (ref 12.0–46.0)
Lymphs Abs: 1.5 10*3/uL (ref 0.7–4.0)
MCHC: 33.3 g/dL (ref 30.0–36.0)
MCV: 89.2 fl (ref 78.0–100.0)
Monocytes Absolute: 0.5 10*3/uL (ref 0.1–1.0)
Monocytes Relative: 8.3 % (ref 3.0–12.0)
Neutro Abs: 3.4 10*3/uL (ref 1.4–7.7)
Neutrophils Relative %: 62 % (ref 43.0–77.0)
Platelets: 138 10*3/uL — ABNORMAL LOW (ref 150.0–400.0)
RBC: 4.84 Mil/uL (ref 4.22–5.81)
RDW: 14.5 % (ref 11.5–15.5)
WBC: 5.5 10*3/uL (ref 4.0–10.5)

## 2021-03-05 LAB — BASIC METABOLIC PANEL
BUN: 13 mg/dL (ref 6–23)
CO2: 24 mEq/L (ref 19–32)
Calcium: 9.4 mg/dL (ref 8.4–10.5)
Chloride: 106 mEq/L (ref 96–112)
Creatinine, Ser: 1.37 mg/dL (ref 0.40–1.50)
GFR: 51.31 mL/min — ABNORMAL LOW (ref >60.00)
Glucose, Bld: 94 mg/dL (ref 70–99)
Potassium: 4.3 mEq/L (ref 3.5–5.1)
Sodium: 140 mEq/L (ref 135–145)

## 2021-03-05 LAB — URINALYSIS, ROUTINE W REFLEX MICROSCOPIC
Bilirubin Urine: NEGATIVE
Hgb urine dipstick: NEGATIVE
Ketones, ur: NEGATIVE
Leukocytes,Ua: NEGATIVE
Nitrite: NEGATIVE
RBC / HPF: NONE SEEN (ref 0–?)
Specific Gravity, Urine: 1.02 (ref 1.000–1.030)
Total Protein, Urine: NEGATIVE
Urine Glucose: NEGATIVE
Urobilinogen, UA: 0.2 (ref 0.0–1.0)
pH: 6 (ref 5.0–8.0)

## 2021-03-05 LAB — LIPID PANEL
Cholesterol: 129 mg/dL (ref 0–200)
HDL: 40.8 mg/dL (ref >39.00)
NonHDL: 87.95
Total CHOL/HDL Ratio: 3
Triglycerides: 239 mg/dL — ABNORMAL HIGH (ref 0.0–149.0)
VLDL: 47.8 mg/dL — ABNORMAL HIGH (ref 0.0–40.0)

## 2021-03-05 LAB — HEPATIC FUNCTION PANEL
ALT: 11 U/L (ref 0–53)
AST: 12 U/L (ref 0–37)
Albumin: 4.4 g/dL (ref 3.5–5.2)
Alkaline Phosphatase: 58 U/L (ref 39–117)
Bilirubin, Direct: 0.1 mg/dL (ref 0.0–0.3)
Total Bilirubin: 0.9 mg/dL (ref 0.2–1.2)
Total Protein: 7 g/dL (ref 6.0–8.3)

## 2021-03-05 LAB — HEMOGLOBIN A1C: Hgb A1c MFr Bld: 6.1 % (ref 4.6–6.5)

## 2021-03-05 LAB — VITAMIN D 25 HYDROXY (VIT D DEFICIENCY, FRACTURES): VITD: 15.38 ng/mL — ABNORMAL LOW (ref 30.00–100.00)

## 2021-03-05 LAB — PSA: PSA: 0.14 ng/mL (ref 0.10–4.00)

## 2021-03-05 LAB — TSH: TSH: 1.25 u[IU]/mL (ref 0.35–5.50)

## 2021-03-05 LAB — LDL CHOLESTEROL, DIRECT: Direct LDL: 60 mg/dL

## 2021-03-05 MED ORDER — LISINOPRIL 40 MG PO TABS: 40.0000 mg | ORAL_TABLET | Freq: Every day | ORAL | 3 refills | Status: DC

## 2021-03-05 MED ORDER — FINASTERIDE 5 MG PO TABS: 5.0000 mg | ORAL_TABLET | Freq: Every day | ORAL | 3 refills | Status: DC

## 2021-03-05 MED ORDER — LEVOTHYROXINE SODIUM 100 MCG PO TABS: 100.0000 ug | ORAL_TABLET | Freq: Every day | ORAL | 3 refills | Status: DC

## 2021-03-05 MED ORDER — COLCHICINE 0.6 MG PO TABS: 0.6000 mg | ORAL_TABLET | Freq: Every day | ORAL | 3 refills | Status: DC

## 2021-03-05 MED ORDER — TAMSULOSIN HCL 0.4 MG PO CAPS: 0.4000 mg | ORAL_CAPSULE | Freq: Every day | ORAL | 3 refills | Status: DC

## 2021-03-05 MED ORDER — INDOMETHACIN 50 MG PO CAPS: 50.0000 mg | ORAL_CAPSULE | Freq: Three times a day (TID) | ORAL | 2 refills | Status: DC | PRN

## 2021-03-05 MED ORDER — ROSUVASTATIN CALCIUM 10 MG PO TABS: 10.0000 mg | ORAL_TABLET | Freq: Every day | ORAL | 3 refills | Status: DC

## 2021-03-05 NOTE — Progress Notes (Signed)
Patient ID: Luis Myers, male   DOB: May 05, 1946, 75 y.o.   MRN: [id number]         Chief Complaint:: wellness exam and low vit d and b12, hld, gout, htn, hyperglycemia        HPI:  Luis Myers is a 75 y.o. male here for wellness exam; decliens covid vaccine, shingrix, o/w up to date with preventive referrals and immunizations                        Also has intermittent mild gout flare and indocin controls ok. Not taking vit d or b12.  Pt denies chest pain, increased sob or doe, wheezing, orthopnea, PND, increased LE swelling, palpitations, dizziness or syncope.   Pt denies polydipsia, polyuria, or new focal neuro s/s.   Trying to follow lower chol diet.   Pt denies fever, wt loss, night sweats, loss of appetite, or other constitutional symptoms     Wt Readings from Last 3 Encounters:  03/05/21 230 lb 3.2 oz (104.4 kg)  10/20/20 223 lb 6.4 oz (101.3 kg)  02/23/20 227 lb (103 kg)   BP Readings from Last 3 Encounters:  03/05/21 124/66  10/20/20 128/78  02/23/20 130/80   Immunization History  Administered Date(s) Administered   Fluad Quad(high Dose 65+) 02/23/2020, 03/05/2021   Influenza Whole 04/23/2007, 04/11/2008, 03/19/2009, 04/10/2010, 04/09/2012   Influenza, Seasonal, Injecte, Preservative Fre 03/10/2013   Influenza-Unspecified 03/10/2014   PFIZER(Purple Top)SARS-COV-2 Vaccination 08/12/2019, 09/14/2019   Pneumococcal Conjugate-13 07/13/2013   Pneumococcal Polysaccharide-23 03/19/2009, 09/16/2017   Td 08/09/2003   Tdap 07/13/2013   Zoster, Live 04/25/2008   There are no preventive care reminders to display for this patient.     Past Medical History:  Diagnosis Date   Acute gouty arthropathy 11/04/2008   ALLERGIC RHINITIS 10/07/2007   ANXIETY 03/04/2007   BENIGN PROSTATIC HYPERTROPHY 03/04/2007   CONJUNCTIVITIS, ACUTE, LEFT 11/27/2007   GLUCOSE INTOLERANCE 04/23/2007   GOUT 03/04/2007   HYPERLIPIDEMIA 03/04/2007   HYPERSOMNIA 02/20/2010   HYPERTENSION 03/04/2007    HYPOGONADISM 06/20/2010   HYPOTHYROIDISM 03/04/2007   Impaired fasting glucose 04/16/2007   Impaired glucose tolerance 01/01/2011   Morbid obesity ([place]) 03/04/2007   PERIPHERAL EDEMA 11/04/2008   Pure hypercholesterolemia 10/17/2008   RASH-NONVESICULAR 07/25/2009   SINUSITIS- ACUTE-NOS 05/16/2008   URI 11/27/2007   VITAMIN D DEFICIENCY 06/20/2010   Past Surgical History:  Procedure Laterality Date   URETER SURGERY  1978   Stretched    reports that he has never smoked. He has never used smokeless tobacco. He reports that he does not drink alcohol and does not use drugs. family history includes Diabetes in an other family member; Heart disease in his brother and father; Lung cancer in his father. Allergies  Allergen Reactions   Atorvastatin Other (See Comments)    Muscle pain   Niacin Other (See Comments)    Tachycardia rate 160 beats / minute   Simvastatin Other (See Comments)    Muscle pain   Allopurinol Rash   Current Outpatient Medications on File Prior to Visit  Medication Sig Dispense Refill   Cholecalciferol (VITAMIN D) 50 MCG (2000 UT) tablet Take 1 tablet (2,000 Units total) by mouth daily. 90 tablet 3   SYRINGE/NEEDLE, DISP, 1 ML (B-D SYRINGE/NEEDLE 1CC/25GX5/8) 25G X 5/8" 1 ML MISC Use to inject vitamin b12 monthly 12 each 0   No current facility-administered medications on file prior to visit.        ROS:  All others reviewed and negative.  Objective        PE:  BP 124/66   Pulse 60   Temp 97.9 F (36.6 C) (Oral)   Resp 18   Ht 5\' 9"  (1.753 m)   Wt 230 lb 3.2 oz (104.4 kg)   SpO2 97%   BMI 33.99 kg/m                 Constitutional: Pt appears in NAD               HENT: Head: NCAT.                Right Ear: External ear normal.                 Left Ear: External ear normal.                Eyes: . Pupils are equal, round, and reactive to light. Conjunctivae and EOM are normal               Nose: without d/c or deformity               Neck: Neck supple. Gross  normal ROM               Cardiovascular: Normal rate and regular rhythm.                 Pulmonary/Chest: Effort normal and breath sounds without rales or wheezing.                Abd:  Soft, NT, ND, + BS, no organomegaly               Neurological: Pt is alert. At baseline orientation, motor grossly intact               Skin: Skin is warm. No rashes, no other new lesions, LE edema - none               Psychiatric: Pt behavior is normal without agitation   Micro: none  Cardiac tracings I have personally interpreted today:  none  Pertinent Radiological findings (summarize): none   Lab Results  Component Value Date   WBC 5.5 03/05/2021   HGB 14.4 03/05/2021   HCT 43.2 03/05/2021   PLT 138.0 (L) 03/05/2021   GLUCOSE 94 03/05/2021   CHOL 129 03/05/2021   TRIG 239.0 (H) 03/05/2021   HDL 40.80 03/05/2021   LDLDIRECT 60.0 03/05/2021   LDLCALC 58 01/26/2020   ALT 11 03/05/2021   AST 12 03/05/2021   NA 140 03/05/2021   K 4.3 03/05/2021   CL 106 03/05/2021   CREATININE 1.37 03/05/2021   BUN 13 03/05/2021   CO2 24 03/05/2021   TSH 1.25 03/05/2021   PSA 0.14 03/05/2021   HGBA1C 6.1 03/05/2021   MICROALBUR <0.7 07/19/2014   Assessment/Plan:  Luis Myers is a 75 y.o. White or Caucasian [1] male with  has a past medical history of Acute gouty arthropathy (11/04/2008), ALLERGIC RHINITIS (10/07/2007), ANXIETY (03/04/2007), BENIGN PROSTATIC HYPERTROPHY (03/04/2007), CONJUNCTIVITIS, ACUTE, LEFT (11/27/2007), GLUCOSE INTOLERANCE (04/23/2007), GOUT (03/04/2007), HYPERLIPIDEMIA (03/04/2007), HYPERSOMNIA (02/20/2010), HYPERTENSION (03/04/2007), HYPOGONADISM (06/20/2010), HYPOTHYROIDISM (03/04/2007), Impaired fasting glucose (04/16/2007), Impaired glucose tolerance (01/01/2011), Morbid obesity ([place]) (03/04/2007), PERIPHERAL EDEMA (11/04/2008), Pure hypercholesterolemia (10/17/2008), RASH-NONVESICULAR (07/25/2009), SINUSITIS- ACUTE-NOS (05/16/2008), URI (11/27/2007), and VITAMIN D DEFICIENCY (06/20/2010).  B12  deficiency Lab Results  Component Value Date   VITAMINB12 197 (L) 01/26/2020   Low, to start oral replacement -  b12 1000 mcg qd   Encounter for well adult exam with abnormal findings Age and sex appropriate education and counseling updated with regular exercise and diet Referrals for preventative services - none needed Immunizations addressed - declines covid vax, shignrx Smoking counseling  - none needed Evidence for depression or other mood disorder - none significant Most recent labs reviewed. I have personally reviewed and have noted: 1) the patient's medical and social history 2) The patient's current medications and supplements 3) The patient's height, weight, and BMI have been recorded in the chart   Pure hypercholesterolemia Lab Results  Component Value Date   LDLCALC 58 01/26/2020   Stable, pt to continue current statin crestor 10   GOUT Stable, continue colchicine and indocin prn, for uric acid,  to f/u any worsening symptoms or concerns  Essential hypertension BP Readings from Last 3 Encounters:  03/05/21 124/66  10/20/20 128/78  02/23/20 130/80   Stable, pt to continue medical treatment lisinopril 40   Vitamin D deficiency Last vitamin D Lab Results  Component Value Date   VD25OH 15.38 (L) 03/05/2021   Low, to start oral replacement   Impaired glucose tolerance Lab Results  Component Value Date   HGBA1C 6.1 03/05/2021   Stable, pt to continue current medical treatment  - diet  Followup: Return in about 1 year (around 03/05/2022).  Cathlean Cower, MD 03/06/2021 9:33 PM [facility]

## 2021-03-05 NOTE — Patient Instructions (Addendum)
You had the flu shot today  Please take OTC Vitamin D3 at 2000 units per day, indefinitely  Please also take B12 1000 mcg per day  Please consider turmeric for the joint pains  Please continue all other medications as before, and refills have been done if requested.  Please have the pharmacy call with any other refills you may need.  Please continue your efforts at being more active, low cholesterol diet, and weight control.  You are otherwise up to date with prevention measures today.  Please keep your appointments with your specialists as you may have planned  Please go to the LAB at the blood drawing area for the tests to be done  You will be contacted by phone if any changes need to be made immediately.  Otherwise, you will receive a letter about your results with an explanation, but please check with MyChart first.  Please remember to sign up for MyChart if you have not done so, as this will be important to you in the future with finding out test results, communicating by private email, and scheduling acute appointments online when needed.  Please make an Appointment to return for your 1 year visit, or sooner if needed

## 2021-03-05 NOTE — Assessment & Plan Note (Signed)
Lab Results  Component Value Date   VITAMINB12 197 (L) 01/26/2020   Low, to start oral replacement - b12 1000 mcg qd

## 2021-03-06 ENCOUNTER — Encounter: Payer: Self-pay | Admitting: Internal Medicine

## 2021-03-06 NOTE — Assessment & Plan Note (Signed)
Age and sex appropriate education and counseling updated with regular exercise and diet Referrals for preventative services - none needed Immunizations addressed - declines covid vax, shignrx Smoking counseling  - none needed Evidence for depression or other mood disorder - none significant Most recent labs reviewed. I have personally reviewed and have noted: 1) the patient's medical and social history 2) The patient's current medications and supplements 3) The patient's height, weight, and BMI have been recorded in the chart

## 2021-03-06 NOTE — Assessment & Plan Note (Signed)
Stable, continue colchicine and indocin prn, for uric acid,  to f/u any worsening symptoms or concerns

## 2021-03-06 NOTE — Assessment & Plan Note (Signed)
Lab Results  Component Value Date   LDLCALC 58 01/26/2020   Stable, pt to continue current statin crestor 10

## 2021-03-06 NOTE — Assessment & Plan Note (Signed)
BP Readings from Last 3 Encounters:  03/05/21 124/66  10/20/20 128/78  02/23/20 130/80   Stable, pt to continue medical treatment lisinopril 40

## 2021-03-06 NOTE — Assessment & Plan Note (Signed)
Lab Results  Component Value Date   HGBA1C 6.1 03/05/2021   Stable, pt to continue current medical treatment  - diet

## 2021-03-06 NOTE — Assessment & Plan Note (Signed)
Last vitamin D Lab Results  Component Value Date   VD25OH 15.38 (L) 03/05/2021   Low, to start oral replacement

## 2021-04-25 ENCOUNTER — Encounter: Payer: Self-pay | Admitting: Internal Medicine

## 2021-04-25 ENCOUNTER — Ambulatory Visit (INDEPENDENT_AMBULATORY_CARE_PROVIDER_SITE_OTHER): Payer: Medicare Other | Admitting: Internal Medicine

## 2021-04-25 ENCOUNTER — Other Ambulatory Visit: Payer: Self-pay

## 2021-04-25 VITALS — BP 128/80 | HR 79 | Resp 18 | Ht 69.0 in | Wt 226.0 lb

## 2021-04-25 DIAGNOSIS — R7302 Impaired glucose tolerance (oral): Secondary | ICD-10-CM | POA: Diagnosis not present

## 2021-04-25 DIAGNOSIS — I10 Essential (primary) hypertension: Secondary | ICD-10-CM

## 2021-04-25 DIAGNOSIS — E559 Vitamin D deficiency, unspecified: Secondary | ICD-10-CM

## 2021-04-25 DIAGNOSIS — M109 Gout, unspecified: Secondary | ICD-10-CM

## 2021-04-25 MED ORDER — PREDNISONE 10 MG PO TABS: ORAL_TABLET | ORAL | 0 refills | Status: DC

## 2021-04-25 NOTE — Patient Instructions (Signed)
Please take all new medication as prescribed - the prednisone  Please continue all other medications as before, and refills have been done if requested.  Please have the pharmacy call with any other refills you may need.  Please continue your efforts at being more active, low cholesterol diet, and weight control.  Please keep your appointments with your specialists as you may have planned

## 2021-04-25 NOTE — Progress Notes (Signed)
Patient ID: Luis Myers, male   DOB: 10/19/1945, 76 y.o.   MRN: [id number]        Chief Complaint: follow up first mcp swelling/pain       HPI:  Luis Myers is a 75 y.o. male here overall doing ok, does c/o first mcp left hand severe swelling pain without trauma, fever, and not responding to indocin and colchicine; Pt denies chest pain, increased sob or doe, wheezing, orthopnea, PND, increased LE swelling, palpitations, dizziness or syncope.   Pt denies polydipsia, polyuria, or new focal neuro s/s.   Pt denies fever, wt loss, night sweats, loss of appetite, or other constitutional symptoms   not taking Vit d  Has hx of recurrent gout in LEs but none prior in the hands       Wt Readings from Last 3 Encounters:  04/25/21 226 lb (102.5 kg)  03/05/21 230 lb 3.2 oz (104.4 kg)  10/20/20 223 lb 6.4 oz (101.3 kg)   BP Readings from Last 3 Encounters:  04/25/21 128/80  03/05/21 124/66  10/20/20 128/78         Past Medical History:  Diagnosis Date   Acute gouty arthropathy 11/04/2008   ALLERGIC RHINITIS 10/07/2007   ANXIETY 03/04/2007   BENIGN PROSTATIC HYPERTROPHY 03/04/2007   CONJUNCTIVITIS, ACUTE, LEFT 11/27/2007   GLUCOSE INTOLERANCE 04/23/2007   GOUT 03/04/2007   HYPERLIPIDEMIA 03/04/2007   HYPERSOMNIA 02/20/2010   HYPERTENSION 03/04/2007   HYPOGONADISM 06/20/2010   HYPOTHYROIDISM 03/04/2007   Impaired fasting glucose 04/16/2007   Impaired glucose tolerance 01/01/2011   Morbid obesity ([place]) 03/04/2007   PERIPHERAL EDEMA 11/04/2008   Pure hypercholesterolemia 10/17/2008   RASH-NONVESICULAR 07/25/2009   SINUSITIS- ACUTE-NOS 05/16/2008   URI 11/27/2007   VITAMIN D DEFICIENCY 06/20/2010   Past Surgical History:  Procedure Laterality Date   URETER SURGERY  1978   Stretched    reports that he has never smoked. He has never used smokeless tobacco. He reports that he does not drink alcohol and does not use drugs. family history includes Diabetes in an other family member; Heart disease in his  brother and father; Lung cancer in his father. Allergies  Allergen Reactions   Atorvastatin Other (See Comments)    Muscle pain   Niacin Other (See Comments)    Tachycardia rate 160 beats / minute   Simvastatin Other (See Comments)    Muscle pain   Allopurinol Rash   Current Outpatient Medications on File Prior to Visit  Medication Sig Dispense Refill   Cholecalciferol (VITAMIN D) 50 MCG (2000 UT) tablet Take 1 tablet (2,000 Units total) by mouth daily. 90 tablet 3   colchicine 0.6 MG tablet Take 1 tablet (0.6 mg total) by mouth daily. 90 tablet 3   finasteride (PROSCAR) 5 MG tablet Take 1 tablet (5 mg total) by mouth daily. 90 tablet 3   indomethacin (INDOCIN) 50 MG capsule Take 1 capsule (50 mg total) by mouth 3 (three) times daily as needed. 90 capsule 2   levothyroxine (SYNTHROID) 100 MCG tablet Take 1 tablet (100 mcg total) by mouth daily. 90 tablet 3   lisinopril (ZESTRIL) 40 MG tablet Take 1 tablet (40 mg total) by mouth daily. 90 tablet 3   rosuvastatin (CRESTOR) 10 MG tablet Take 1 tablet (10 mg total) by mouth daily. 90 tablet 3   SYRINGE/NEEDLE, DISP, 1 ML (B-D SYRINGE/NEEDLE 1CC/25GX5/8) 25G X 5/8" 1 ML MISC Use to inject vitamin b12 monthly 12 each 0   tamsulosin (FLOMAX) 0.4 MG CAPS capsule  Take 1 capsule (0.4 mg total) by mouth daily. 90 capsule 3   No current facility-administered medications on file prior to visit.        ROS:  All others reviewed and negative.  Objective        PE:  BP 128/80   Pulse 79   Resp 18   Ht 5\' 9"  (1.753 m)   Wt 226 lb (102.5 kg)   SpO2 96%   BMI 33.37 kg/m                 Constitutional: Pt appears in NAD               HENT: Head: NCAT.                Right Ear: External ear normal.                 Left Ear: External ear normal.                Eyes: . Pupils are equal, round, and reactive to light. Conjunctivae and EOM are normal               Nose: without d/c or deformity               Neck: Neck supple. Gross normal ROM                Cardiovascular: Normal rate and regular rhythm.                 Pulmonary/Chest: Effort normal and breath sounds without rales or wheezing.                Left first MCP with 2+ tender bony and soft tissue swelling               Neurological: Pt is alert. At baseline orientation, motor grossly intact               Skin: Skin is warm. No rashes, no other new lesions, LE edema - none               Psychiatric: Pt behavior is normal without agitation   Micro: none  Cardiac tracings I have personally interpreted today:  none  Pertinent Radiological findings (summarize): none   Lab Results  Component Value Date   WBC 5.5 03/05/2021   HGB 14.4 03/05/2021   HCT 43.2 03/05/2021   PLT 138.0 (L) 03/05/2021   GLUCOSE 94 03/05/2021   CHOL 129 03/05/2021   TRIG 239.0 (H) 03/05/2021   HDL 40.80 03/05/2021   LDLDIRECT 60.0 03/05/2021   LDLCALC 58 01/26/2020   ALT 11 03/05/2021   AST 12 03/05/2021   NA 140 03/05/2021   K 4.3 03/05/2021   CL 106 03/05/2021   CREATININE 1.37 03/05/2021   BUN 13 03/05/2021   CO2 24 03/05/2021   TSH 1.25 03/05/2021   PSA 0.14 03/05/2021   HGBA1C 6.1 03/05/2021   MICROALBUR <0.7 07/19/2014   Assessment/Plan:  Luis Myers is a 75 y.o. White or Caucasian [1] male with  has a past medical history of Acute gouty arthropathy (11/04/2008), ALLERGIC RHINITIS (10/07/2007), ANXIETY (03/04/2007), BENIGN PROSTATIC HYPERTROPHY (03/04/2007), CONJUNCTIVITIS, ACUTE, LEFT (11/27/2007), GLUCOSE INTOLERANCE (04/23/2007), GOUT (03/04/2007), HYPERLIPIDEMIA (03/04/2007), HYPERSOMNIA (02/20/2010), HYPERTENSION (03/04/2007), HYPOGONADISM (06/20/2010), HYPOTHYROIDISM (03/04/2007), Impaired fasting glucose (04/16/2007), Impaired glucose tolerance (01/01/2011), Morbid obesity ([place]) (03/04/2007), PERIPHERAL EDEMA (11/04/2008), Pure hypercholesterolemia (10/17/2008), RASH-NONVESICULAR (07/25/2009), SINUSITIS- ACUTE-NOS (05/16/2008), URI (11/27/2007), and VITAMIN D DEFICIENCY (06/20/2010).  Vitamin  D deficiency Last vitamin D Lab Results  Component Value Date   VD25OH 15.38 (L) 03/05/2021   Low, to start oral replacement   Acute gouty arthritis I suspect new onset hand acute gouty arthritis - for predpac asd,  to f/u any worsening symptoms or concerns  Essential hypertension BP Readings from Last 3 Encounters:  04/25/21 128/80  03/05/21 124/66  10/20/20 128/78   Stable, pt to continue medical treatment lisinopril 40   Impaired glucose tolerance Lab Results  Component Value Date   HGBA1C 6.1 03/05/2021   Stable, pt to continue current medical treatment  - diet  Followup: Return if symptoms worsen or fail to improve.  Cathlean Cower, MD 04/29/2021 5:20 AM [facility]

## 2021-04-29 ENCOUNTER — Encounter: Payer: Self-pay | Admitting: Internal Medicine

## 2021-04-29 NOTE — Assessment & Plan Note (Signed)
Lab Results  Component Value Date   HGBA1C 6.1 03/05/2021   Stable, pt to continue current medical treatment  - diet

## 2021-04-29 NOTE — Assessment & Plan Note (Signed)
BP Readings from Last 3 Encounters:  04/25/21 128/80  03/05/21 124/66  10/20/20 128/78   Stable, pt to continue medical treatment lisinopril 40

## 2021-04-29 NOTE — Assessment & Plan Note (Signed)
I suspect new onset hand acute gouty arthritis - for predpac asd,  to f/u any worsening symptoms or concerns

## 2021-04-29 NOTE — Assessment & Plan Note (Signed)
Last vitamin D Lab Results  Component Value Date   VD25OH 15.38 (L) 03/05/2021   Low, to start oral replacement

## 2021-05-06 ENCOUNTER — Other Ambulatory Visit: Payer: Self-pay | Admitting: Internal Medicine

## 2021-09-04 ENCOUNTER — Ambulatory Visit: Payer: Medicare Other | Admitting: Internal Medicine

## 2021-09-11 ENCOUNTER — Encounter: Payer: Self-pay | Admitting: Internal Medicine

## 2021-09-11 ENCOUNTER — Ambulatory Visit (INDEPENDENT_AMBULATORY_CARE_PROVIDER_SITE_OTHER): Payer: Medicare Other | Admitting: Internal Medicine

## 2021-09-11 VITALS — BP 142/78 | HR 81 | Temp 97.6°F | Ht 69.0 in | Wt 231.0 lb

## 2021-09-11 DIAGNOSIS — E538 Deficiency of other specified B group vitamins: Secondary | ICD-10-CM | POA: Diagnosis not present

## 2021-09-11 DIAGNOSIS — I6522 Occlusion and stenosis of left carotid artery: Secondary | ICD-10-CM | POA: Diagnosis not present

## 2021-09-11 DIAGNOSIS — E7800 Pure hypercholesterolemia, unspecified: Secondary | ICD-10-CM | POA: Diagnosis not present

## 2021-09-11 DIAGNOSIS — E559 Vitamin D deficiency, unspecified: Secondary | ICD-10-CM | POA: Diagnosis not present

## 2021-09-11 DIAGNOSIS — I10 Essential (primary) hypertension: Secondary | ICD-10-CM

## 2021-09-11 DIAGNOSIS — R7302 Impaired glucose tolerance (oral): Secondary | ICD-10-CM

## 2021-09-11 DIAGNOSIS — R109 Unspecified abdominal pain: Secondary | ICD-10-CM | POA: Diagnosis not present

## 2021-09-11 DIAGNOSIS — N401 Enlarged prostate with lower urinary tract symptoms: Secondary | ICD-10-CM

## 2021-09-11 LAB — URINALYSIS, ROUTINE W REFLEX MICROSCOPIC
Bilirubin Urine: NEGATIVE
Hgb urine dipstick: NEGATIVE
Ketones, ur: NEGATIVE
Leukocytes,Ua: NEGATIVE
Nitrite: NEGATIVE
RBC / HPF: NONE SEEN (ref 0–?)
Specific Gravity, Urine: 1.01 (ref 1.000–1.030)
Total Protein, Urine: NEGATIVE
Urine Glucose: NEGATIVE
Urobilinogen, UA: 0.2 (ref 0.0–1.0)
pH: 6.5 (ref 5.0–8.0)

## 2021-09-11 MED ORDER — DUTASTERIDE 0.5 MG PO CAPS: 0.5000 mg | ORAL_CAPSULE | Freq: Every day | ORAL | 3 refills | Status: DC

## 2021-09-11 MED ORDER — ASPIRIN 81 MG PO TBEC: 81.0000 mg | DELAYED_RELEASE_TABLET | Freq: Every day | ORAL | 12 refills | Status: DC

## 2021-09-11 NOTE — Progress Notes (Signed)
Patient ID: Luis Myers, male   DOB: 1945-10-21, 76 y.o.   MRN: [id number] ? ? ? ?    Chief Complaint: follow up left carotid disease, elev bp, low vit D and B12, hld, right flank pain, bph ? ?     HPI:  Luis Myers is a 76 y.o. male here with c/o hx of left carotid dz, asking for repeat evaluuation.  Pt denies chest pain, increased sob or doe, wheezing, orthopnea, PND, increased LE swelling, palpitations, dizziness or syncope.   Pt denies polydipsia, polyuria, or new focal neuro s/s.    Pt denies fever, wt loss, night sweats, loss of appetite, or other constitutional symptoms  Not taking Vit D or B12.  Also did have mild right flank p ain for 3 days, worse to palpate and twist about, no falls, and Denies urinary symptoms such as dysuria, frequency, urgency, flank pain, hematuria or n/v, fever, chills.  Does have BPH and insurance asking him to check into change of proscar ro avodart better covered under his insurance.  Trying to follow lower chol diet.   ?Wt Readings from Last 3 Encounters:  ?09/11/21 231 lb (104.8 kg)  ?04/25/21 226 lb (102.5 kg)  ?03/05/21 230 lb 3.2 oz (104.4 kg)  ? ?BP Readings from Last 3 Encounters:  ?09/11/21 (!) 142/78  ?04/25/21 128/80  ?03/05/21 124/66  ? ?      ?Past Medical History:  ?Diagnosis Date  ? Acute gouty arthropathy 11/04/2008  ? ALLERGIC RHINITIS 10/07/2007  ? ANXIETY 03/04/2007  ? BENIGN PROSTATIC HYPERTROPHY 03/04/2007  ? CONJUNCTIVITIS, ACUTE, LEFT 11/27/2007  ? GLUCOSE INTOLERANCE 04/23/2007  ? GOUT 03/04/2007  ? HYPERLIPIDEMIA 03/04/2007  ? HYPERSOMNIA 02/20/2010  ? HYPERTENSION 03/04/2007  ? HYPOGONADISM 06/20/2010  ? HYPOTHYROIDISM 03/04/2007  ? Impaired fasting glucose 04/16/2007  ? Impaired glucose tolerance 01/01/2011  ? Morbid obesity ([place]) 03/04/2007  ? PERIPHERAL EDEMA 11/04/2008  ? Pure hypercholesterolemia 10/17/2008  ? RASH-NONVESICULAR 07/25/2009  ? SINUSITIS- ACUTE-NOS 05/16/2008  ? URI 11/27/2007  ? VITAMIN D DEFICIENCY 06/20/2010  ? ?Past Surgical History:  ?Procedure  Laterality Date  ? [place]  ? Stretched  ? ? reports that he has never smoked. He has never used smokeless tobacco. He reports that he does not drink alcohol and does not use drugs. ?family history includes Diabetes in an other family member; Heart disease in his brother and father; Lung cancer in his father. ?Allergies  ?Allergen Reactions  ? Atorvastatin Other (See Comments)  ?  Muscle pain  ? Niacin Other (See Comments)  ?  Tachycardia rate 160 beats / minute  ? Simvastatin Other (See Comments)  ?  Muscle pain  ? Allopurinol Rash  ? ?Current Outpatient Medications on File Prior to Visit  ?Medication Sig Dispense Refill  ? Cholecalciferol (VITAMIN D) 50 MCG (2000 UT) tablet Take 1 tablet (2,000 Units total) by mouth daily. 90 tablet 3  ? colchicine 0.6 MG tablet Take 1 tablet (0.6 mg total) by mouth daily. 90 tablet 3  ? indomethacin (INDOCIN) 50 MG capsule Take 1 capsule (50 mg total) by mouth 3 (three) times daily as needed. 90 capsule 2  ? levothyroxine (SYNTHROID) 100 MCG tablet Take 1 tablet (100 mcg total) by mouth daily. 90 tablet 3  ? lisinopril (ZESTRIL) 40 MG tablet Take 1 tablet (40 mg total) by mouth daily. 90 tablet 3  ? rosuvastatin (CRESTOR) 10 MG tablet Take 1 tablet (10 mg total) by mouth daily. 90 tablet 3  ?  tamsulosin (FLOMAX) 0.4 MG CAPS capsule Take 1 capsule (0.4 mg total) by mouth daily. 90 capsule 3  ? ?No current facility-administered medications on file prior to visit.  ? ?     ROS:  All others reviewed and negative. ? ?Objective  ? ?     PE:  BP (!) 142/78 (BP Location: Left Arm, Patient Position: Sitting, Cuff Size: Large)   Pulse 81   Temp 97.6 ?F (36.4 ?C) (Oral)   Ht '5\' 9"'$  (1.753 m)   Wt 231 lb (104.8 kg)   SpO2 99%   BMI 34.11 kg/m?  ? ?              Constitutional: Pt appears in NAD ?              HENT: Head: NCAT.  ?              Right Ear: External ear normal.   ?              Left Ear: External ear normal.  ?              Eyes: . Pupils are equal, round, and  reactive to light. Conjunctivae and EOM are normal ?              Nose: without d/c or deformity ?              Neck: Neck supple. Gross normal ROM ?              Cardiovascular: Normal rate and regular rhythm.   ?              Pulmonary/Chest: Effort normal and breath sounds without rales or wheezing.  ?              Abd:  Soft, NT, ND, + BS, no organomegaly ?              Neurological: Pt is alert. At baseline orientation, motor grossly intact ?              Skin: Skin is warm. No rashes, no other new lesions, LE edema - none ?              Psychiatric: Pt behavior is normal without agitation  ? ?Micro: none ? ?Cardiac tracings I have personally interpreted today:  none ? ?Pertinent Radiological findings (summarize): none  ? ?Lab Results  ?Component Value Date  ? WBC 5.5 03/05/2021  ? HGB 14.4 03/05/2021  ? HCT 43.2 03/05/2021  ? PLT 138.0 (L) 03/05/2021  ? GLUCOSE 94 03/05/2021  ? CHOL 129 03/05/2021  ? TRIG 239.0 (H) 03/05/2021  ? HDL 40.80 03/05/2021  ? LDLDIRECT 60.0 03/05/2021  ? [place] 58 01/26/2020  ? ALT 11 03/05/2021  ? AST 12 03/05/2021  ? NA 140 03/05/2021  ? K 4.3 03/05/2021  ? CL 106 03/05/2021  ? CREATININE 1.37 03/05/2021  ? BUN 13 03/05/2021  ? CO2 24 03/05/2021  ? TSH 1.25 03/05/2021  ? PSA 0.14 03/05/2021  ? HGBA1C 6.1 03/05/2021  ? MICROALBUR <0.7 07/19/2014  ? ?Assessment/Plan:  ?[name] is a 76 y.o. White or Caucasian [1] male with  has a past medical history of Acute gouty arthropathy (11/04/2008), ALLERGIC RHINITIS (10/07/2007), ANXIETY (03/04/2007), BENIGN PROSTATIC HYPERTROPHY (03/04/2007), CONJUNCTIVITIS, ACUTE, LEFT (11/27/2007), GLUCOSE INTOLERANCE (04/23/2007), GOUT (03/04/2007), HYPERLIPIDEMIA (03/04/2007), HYPERSOMNIA (02/20/2010), HYPERTENSION (03/04/2007), HYPOGONADISM (06/20/2010), HYPOTHYROIDISM (03/04/2007), Impaired fasting glucose (04/16/2007), Impaired glucose tolerance (01/01/2011), Morbid obesity ([place]) (  03/04/2007), PERIPHERAL EDEMA (11/04/2008), Pure hypercholesterolemia  (10/17/2008), RASH-NONVESICULAR (07/25/2009), SINUSITIS- ACUTE-NOS (05/16/2008), URI (11/27/2007), and VITAMIN D DEFICIENCY (06/20/2010). ? ?Right flank pain ?Exam benign, but suspect msk etiology, for UA with labs and follow ? ?B12 deficiency ?Lab Results  ?Component Value Date  ? VITAMINB12 169 (L) 03/05/2021  ? ?Low, to start oral replacement - b12 1000 mcg qd ? ? ?Essential hypertension ?BP Readings from Last 3 Encounters:  ?09/11/21 (!) 142/78  ?04/25/21 128/80  ?03/05/21 124/66  ? ?Mild uncontrolled, but BP at home < 140/90, pt to continue medical treatment lisinopril ? ? ?Impaired glucose tolerance ?Lab Results  ?Component Value Date  ? HGBA1C 6.1 03/05/2021  ? ?Stable, pt to continue current medical treatment  - diet ? ? ?Pure hypercholesterolemia ?Lab Results  ?Component Value Date  ? [place] 58 01/26/2020  ? ?Stable, pt to continue current statin crestor ? ? ?Vitamin D deficiency ?Last vitamin D ?Lab Results  ?Component Value Date  ? VD25OH 15.38 (L) 03/05/2021  ? ?Low, to start oral replacement ? ? ?Stenosis of left carotid artery ?Very mild on last carotid US in may 2017; to restart asa 81 qd, and f/u u/s testing ? ?BPH (benign prostatic hyperplasia) ?Ok for change proscar to avodart due to insurance coverage ? ?Followup: Return if symptoms worsen or fail to improve. ? ?Cathlean Cower, MD 09/16/2021 3:38 PM ?[place] [facility] ?

## 2021-09-11 NOTE — Patient Instructions (Addendum)
Ok to restart the aspirin 81 mg per day (enteric coated) ? ?Ok to change the finasteride to the avodart due to the backorder issue ? ?Please continue all other medications as before, and refills have been done if requested. ? ?Please have the pharmacy call with any other refills you may need. ? ?Please continue your efforts at being more active, low cholesterol diet, and weight control. ? ?You are otherwise up to date with prevention measures today. ? ?Please keep your appointments with your specialists as you may have planned ? ?You will be contacted regarding the referral for: carotid artery testing ? ?Please go to the LAB at the blood drawing area for the tests to be done  - ONLY the urine testing today ? ?You will be contacted by phone if any changes need to be made immediately.  Otherwise, you will receive a letter about your results with an explanation, but please check with MyChart first. ? ?Please remember to sign up for MyChart if you have not done so, as this will be important to you in the future with finding out test results, communicating by private email, and scheduling acute appointments online when needed. ? ? ? ? ? ? ? ? ? ?

## 2021-09-12 ENCOUNTER — Ambulatory Visit (HOSPITAL_COMMUNITY)
Admission: RE | Admit: 2021-09-12 | Discharge: 2021-09-12 | Disposition: A | Discharge status: Home / Self Care | Payer: Medicare Other | Source: Ambulatory Visit | Attending: Internal Medicine | Admitting: Internal Medicine

## 2021-09-12 DIAGNOSIS — I6522 Occlusion and stenosis of left carotid artery: Secondary | ICD-10-CM | POA: Diagnosis not present

## 2021-09-16 ENCOUNTER — Encounter: Payer: Self-pay | Admitting: Internal Medicine

## 2021-09-16 DIAGNOSIS — I6522 Occlusion and stenosis of left carotid artery: Secondary | ICD-10-CM | POA: Insufficient documentation

## 2021-09-16 NOTE — Assessment & Plan Note (Signed)
BP Readings from Last 3 Encounters:  ?09/11/21 (!) 142/78  ?04/25/21 128/80  ?03/05/21 124/66  ? ?Mild uncontrolled, but BP at home < 140/90, pt to continue medical treatment lisinopril ? ?

## 2021-09-16 NOTE — Assessment & Plan Note (Signed)
Very mild on last carotid US in may 2017; to restart asa 81 qd, and f/u u/s testing ?

## 2021-09-16 NOTE — Assessment & Plan Note (Signed)
Ok for change proscar to avodart due to insurance coverage ?

## 2021-09-16 NOTE — Assessment & Plan Note (Signed)
Lab Results  Component Value Date   VITAMINB12 169 (L) 03/05/2021   Low, to start oral replacement - b12 1000 mcg qd  

## 2021-09-16 NOTE — Assessment & Plan Note (Signed)
Last vitamin D ?Lab Results  ?Component Value Date  ? VD25OH 15.38 (L) 03/05/2021  ? ?Low, to start oral replacement ? ?

## 2021-09-16 NOTE — Assessment & Plan Note (Signed)
Lab Results  ?Component Value Date  ? [place] 58 01/26/2020  ? ?Stable, pt to continue current statin crestor ? ?

## 2021-09-16 NOTE — Assessment & Plan Note (Signed)
Exam benign, but suspect msk etiology, for UA with labs and follow ?

## 2021-09-16 NOTE — Assessment & Plan Note (Signed)
Lab Results  ?Component Value Date  ? HGBA1C 6.1 03/05/2021  ? ?Stable, pt to continue current medical treatment  - diet ? ?

## 2021-10-17 ENCOUNTER — Telehealth: Payer: Self-pay | Admitting: Internal Medicine

## 2021-10-17 NOTE — Telephone Encounter (Signed)
N/A unable to leave a message for patient to call back to schedule Medicare Annual Wellness Visit  ? ?No hx of AWV eligible as of 12/08/20 ? ?Please schedule at anytime with [facility] if patient calls the office back.   ? ? ?Any questions, please call me at [phone]   ?

## 2021-10-21 ENCOUNTER — Other Ambulatory Visit: Payer: Self-pay | Admitting: Internal Medicine

## 2021-10-22 NOTE — Telephone Encounter (Signed)
Please refill as per office routine med refill policy (all routine meds to be refilled for 3 mo or monthly (per pt preference) up to one year from last visit, then month to month grace period for 3 mo, then further med refills will have to be denied) ? ?

## 2021-11-02 ENCOUNTER — Encounter: Payer: Self-pay | Admitting: Gastroenterology

## 2021-11-15 ENCOUNTER — Ambulatory Visit (AMBULATORY_SURGERY_CENTER): Payer: Medicare Other | Admitting: *Deleted

## 2021-11-15 VITALS — Ht 69.0 in | Wt 229.0 lb

## 2021-11-15 DIAGNOSIS — Z1211 Encounter for screening for malignant neoplasm of colon: Secondary | ICD-10-CM

## 2021-11-15 MED ORDER — NA SULFATE-K SULFATE-MG SULF 17.5-3.13-1.6 GM/177ML PO SOLN: 1.0000 | ORAL | 0 refills | Status: DC

## 2021-11-15 NOTE — Progress Notes (Signed)
Patient is here in-person for PV. Patient denies any allergies to eggs or soy. Patient denies any problems with anesthesia/sedation. Patient is not on any oxygen at home. Patient is not taking any diet/weight loss medications or blood thinners. Patient is aware of our care-partner policy. Patient notified to use  Good-Rx for prescription.   EMMI education assigned to the patient for the procedure, sent to [place].

## 2021-11-19 DIAGNOSIS — C44319 Basal cell carcinoma of skin of other parts of face: Secondary | ICD-10-CM | POA: Diagnosis not present

## 2021-11-19 DIAGNOSIS — L821 Other seborrheic keratosis: Secondary | ICD-10-CM | POA: Diagnosis not present

## 2021-12-02 ENCOUNTER — Encounter: Payer: Self-pay | Admitting: Certified Registered Nurse Anesthetist

## 2021-12-06 ENCOUNTER — Encounter: Payer: Self-pay | Admitting: Gastroenterology

## 2021-12-06 ENCOUNTER — Telehealth: Payer: Self-pay | Admitting: Gastroenterology

## 2021-12-06 DIAGNOSIS — Z1211 Encounter for screening for malignant neoplasm of colon: Secondary | ICD-10-CM

## 2021-12-06 MED ORDER — NA SULFATE-K SULFATE-MG SULF 17.5-3.13-1.6 GM/177ML PO SOLN: 1.0000 | ORAL | 0 refills | Status: DC

## 2021-12-06 NOTE — Telephone Encounter (Signed)
Suprep rx resent to [facility] per pt's request.

## 2021-12-06 NOTE — Telephone Encounter (Signed)
Patient stated Pharmacy has not contacted him regarding picking up suprep.  Please call in to [facility] on [address].

## 2021-12-13 ENCOUNTER — Encounter: Payer: Self-pay | Admitting: Certified Registered Nurse Anesthetist

## 2021-12-14 ENCOUNTER — Ambulatory Visit (AMBULATORY_SURGERY_CENTER): Payer: Medicare Other | Admitting: Gastroenterology

## 2021-12-14 ENCOUNTER — Encounter: Payer: Self-pay | Admitting: Gastroenterology

## 2021-12-14 VITALS — BP 110/70 | HR 56 | Temp 97.1°F | Resp 12 | Ht 69.0 in | Wt 229.0 lb

## 2021-12-14 DIAGNOSIS — D124 Benign neoplasm of descending colon: Secondary | ICD-10-CM

## 2021-12-14 DIAGNOSIS — Z1211 Encounter for screening for malignant neoplasm of colon: Secondary | ICD-10-CM | POA: Diagnosis not present

## 2021-12-14 DIAGNOSIS — D122 Benign neoplasm of ascending colon: Secondary | ICD-10-CM

## 2021-12-14 MED ORDER — SODIUM CHLORIDE 0.9 % IV SOLN: 500.0000 mL | Freq: Once | INTRAVENOUS | Status: DC

## 2021-12-14 NOTE — Progress Notes (Signed)
Report given to PACU, vss 

## 2021-12-14 NOTE — Op Note (Signed)
[facility] Patient Name: Luis Myers Procedure Date: 12/14/2021 8:42 AM MRN: [id number] Endoscopist: Mallie Mussel L. Loletha Carrow , MD Age: 76 Referring MD:  Date of Birth: [birth year] Gender: Male Account #: [id number] Procedure:                Colonoscopy Indications:              Screening for colorectal malignant neoplasm                           no polyps 2013 or 2003 Medicines:                Monitored Anesthesia Care Procedure:                Pre-Anesthesia Assessment:                           - Prior to the procedure, a History and Physical                            was performed, and patient medications and                            allergies were reviewed. The patient's tolerance of                            previous anesthesia was also reviewed. The risks                            and benefits of the procedure and the sedation                            options and risks were discussed with the patient.                            All questions were answered, and informed consent                            was obtained. Prior Anticoagulants: The patient has                            taken no previous anticoagulant or antiplatelet                            agents. ASA Grade Assessment: II - A patient with                            mild systemic disease. After reviewing the risks                            and benefits, the patient was deemed in                            satisfactory condition to undergo the procedure.  After obtaining informed consent, the colonoscope                            was passed under direct vision. Throughout the                            procedure, the patient's blood pressure, pulse, and                            oxygen saturations were monitored continuously. The                            CF [id number] #[id number] was introduced through the anus                            and advanced to the the cecum, identified by                             appendiceal orifice and ileocecal valve. The                            colonoscopy was performed without difficulty. The                            patient tolerated the procedure well. The quality                            of the bowel preparation was excellent. The                            ileocecal valve, appendiceal orifice, and rectum                            were photographed. Scope insertion 08:27, cecum                            reached 08:29, scope out 08:40 (withdrawal time 11                            minutes) Findings:                 The perianal and digital rectal examinations were                            normal.                           Repeat examination of right colon under NBI                            performed.                           A diminutive polyp was found in the proximal  descending colon. The polyp was sessile. The polyp                            was removed with a cold snare. Resection and                            retrieval were complete.                           A few small-mouthed diverticula were found in the                            left colon and right colon.                           The exam was otherwise without abnormality on                            direct and retroflexion views. Complications:            No immediate complications. Estimated Blood Loss:     Estimated blood loss was minimal. Impression:               - One diminutive polyp in the proximal descending                            colon, removed with a cold snare. Resected and                            retrieved.                           - Diverticulosis in the left colon and in the right                            colon.                           - The examination was otherwise normal on direct                            and retroflexion views. Recommendation:           - Patient has a contact number available  for                            emergencies. The signs and symptoms of potential                            delayed complications were discussed with the                            patient. Return to normal activities tomorrow.                            Written discharge instructions were provided to the  patient.                           - Resume previous diet.                           - Continue present medications.                           - Await pathology results.                           - Repeat colonoscopy is recommended for                            surveillance. The colonoscopy date will be                            determined after pathology results from today's                            exam become available for review. Krikor Willet L. Loletha Carrow, MD 12/14/2021 8:59:03 AM This report has been signed electronically.

## 2021-12-14 NOTE — Patient Instructions (Signed)
Please read handouts provided. Continue present medications. Await pathology results.   YOU HAD AN ENDOSCOPIC PROCEDURE TODAY AT THE [facility]:   Refer to the procedure report that was given to you for any specific questions about what was found during the examination.  If the procedure report does not answer your questions, please call your gastroenterologist to clarify.  If you requested that your care partner not be given the details of your procedure findings, then the procedure report has been included in a sealed envelope for you to review at your convenience later.  YOU SHOULD EXPECT: Some feelings of bloating in the abdomen. Passage of more gas than usual.  Walking can help get rid of the air that was put into your GI tract during the procedure and reduce the bloating. If you had a lower endoscopy (such as a colonoscopy or flexible sigmoidoscopy) you may notice spotting of blood in your stool or on the toilet paper. If you underwent a bowel prep for your procedure, you may not have a normal bowel movement for a few days.  Please Note:  You might notice some irritation and congestion in your nose or some drainage.  This is from the oxygen used during your procedure.  There is no need for concern and it should clear up in a day or so.  SYMPTOMS TO REPORT IMMEDIATELY:  Following lower endoscopy (colonoscopy or flexible sigmoidoscopy):  Excessive amounts of blood in the stool  Significant tenderness or worsening of abdominal pains  Swelling of the abdomen that is new, acute  Fever of 100F or higher  For urgent or emergent issues, a gastroenterologist can be reached at any hour by calling [phone]. Do not use MyChart messaging for urgent concerns.    DIET:  We do recommend a small meal at first, but then you may proceed to your regular diet.  Drink plenty of fluids but you should avoid alcoholic beverages for 24 hours.  ACTIVITY:  You should plan to take it easy for  the rest of today and you should NOT DRIVE or use heavy machinery until tomorrow (because of the sedation medicines used during the test).    FOLLOW UP: Our staff will call the number listed on your records the next business day following your procedure.  We will call around 7:15- 8:00 am to check on you and address any questions or concerns that you may have regarding the information given to you following your procedure. If we do not reach you, we will leave a message.  If you develop any symptoms (ie: fever, flu-like symptoms, shortness of breath, cough etc.) before then, please call [phone].  If you test positive for Covid 19 in the 2 weeks post procedure, please call and report this information to us.    If any biopsies were taken you will be contacted by phone or by letter within the next 1-3 weeks.  Please call us at [phone] if you have not heard about the biopsies in 3 weeks.    SIGNATURES/CONFIDENTIALITY: You and/or your care partner have signed paperwork which will be entered into your electronic medical record.  These signatures attest to the fact that that the information above on your After Visit Summary has been reviewed and is understood.  Full responsibility of the confidentiality of this discharge information lies with you and/or your care-partner.  

## 2021-12-14 NOTE — Progress Notes (Signed)
Called to room to assist during endoscopic procedure.  Patient ID and intended procedure confirmed with present staff. Received instructions for my participation in the procedure from the performing physician.  

## 2021-12-14 NOTE — Progress Notes (Signed)
History and Physical:  This patient presents for endoscopic testing for: Encounter Diagnosis  Name Primary?   Special screening for malignant neoplasms, colon Yes    Average risk colorectal cancer, screening colonoscopy today.  No polyps last colonoscopy June 2013, no polyps in 2003. Patient denies chronic abdominal pain, rectal bleeding, constipation or diarrhea.   Patient is otherwise without complaints or active issues today.   Past Medical History: Past Medical History:  Diagnosis Date   Acute gouty arthropathy 11/04/2008   ALLERGIC RHINITIS 10/07/2007   ANXIETY 03/04/2007   BENIGN PROSTATIC HYPERTROPHY 03/04/2007   CONJUNCTIVITIS, ACUTE, LEFT 11/27/2007   GLUCOSE INTOLERANCE 04/23/2007   GOUT 03/04/2007   HYPERLIPIDEMIA 03/04/2007   HYPERSOMNIA 02/20/2010   HYPERTENSION 03/04/2007   HYPOGONADISM 06/20/2010   HYPOTHYROIDISM 03/04/2007   Impaired fasting glucose 04/16/2007   Impaired glucose tolerance 01/01/2011   Morbid obesity ([place]) 03/04/2007   PERIPHERAL EDEMA 11/04/2008   Pure hypercholesterolemia 10/17/2008   RASH-NONVESICULAR 07/25/2009   SINUSITIS- ACUTE-NOS 05/16/2008   URI 11/27/2007   VITAMIN D DEFICIENCY 06/20/2010     Past Surgical History: Past Surgical History:  Procedure Laterality Date   COLONOSCOPY  11/11/2011   Dr.Patterson   HERNIA REPAIR     URETER SURGERY  06/10/1976   Stretched    Allergies: Allergies  Allergen Reactions   Atorvastatin Other (See Comments)    Muscle pain   Niacin Other (See Comments)    Tachycardia rate 160 beats / minute   Simvastatin Other (See Comments)    Muscle pain   Allopurinol Rash    Outpatient Meds: Current Outpatient Medications  Medication Sig Dispense Refill   Boswellia-Glucosamine-Vit D (OSTEO BI-FLEX ONE PER DAY PO) Take by mouth.     Cholecalciferol (VITAMIN D) 50 MCG (2000 UT) tablet Take 1 tablet (2,000 Units total) by mouth daily. 90 tablet 3   colchicine 0.6 MG tablet Take 1 tablet (0.6 mg total) by mouth  daily. 90 tablet 3   Cyanocobalamin (VITAMIN B-12 PO) Take by mouth.     dutasteride (AVODART) 0.5 MG capsule Take 1 capsule (0.5 mg total) by mouth daily. 90 capsule 3   levothyroxine (SYNTHROID) 100 MCG tablet Take 1 tablet (100 mcg total) by mouth daily. 90 tablet 3   lisinopril (ZESTRIL) 40 MG tablet Take 1 tablet (40 mg total) by mouth daily. 90 tablet 3   rosuvastatin (CRESTOR) 10 MG tablet TAKE 1 TABLET BY MOUTH  DAILY 90 tablet 1   tamsulosin (FLOMAX) 0.4 MG CAPS capsule Take 1 capsule (0.4 mg total) by mouth daily. 90 capsule 3   indomethacin (INDOCIN) 50 MG capsule Take 1 capsule (50 mg total) by mouth 3 (three) times daily as needed. 90 capsule 2   Current Facility-Administered Medications  Medication Dose Route Frequency Provider Last Rate Last Admin   0.9 %  sodium chloride infusion  500 mL Intravenous Once Danis, Estill Cotta III, MD          ___________________________________________________________________ Objective   Exam:  BP 134/78   Pulse 73   Temp (!) 97.1 F (36.2 C) (Temporal)   Ht '5\' 9"'$  (1.753 m)   Wt 229 lb (103.9 kg)   SpO2 98%   BMI 33.82 kg/m   CV: RRR without murmur, S1/S2 Resp: clear to auscultation bilaterally, normal RR and effort noted GI: soft, no tenderness, with active bowel sounds.   Assessment: Encounter Diagnosis  Name Primary?   Special screening for malignant neoplasms, colon Yes     Plan: Colonoscopy  The benefits and risks of the planned procedure were described in detail with the patient or (when appropriate) their health care proxy.  Risks were outlined as including, but not limited to, bleeding, infection, perforation, adverse medication reaction leading to cardiac or pulmonary decompensation, pancreatitis (if ERCP).  The limitation of incomplete mucosal visualization was also discussed.  No guarantees or warranties were given.    The patient is appropriate for an endoscopic procedure in the ambulatory setting.   - Wilfrid Lund, MD

## 2021-12-17 ENCOUNTER — Other Ambulatory Visit: Payer: Self-pay | Admitting: Internal Medicine

## 2021-12-17 ENCOUNTER — Telehealth: Payer: Self-pay | Admitting: *Deleted

## 2021-12-17 DIAGNOSIS — D485 Neoplasm of uncertain behavior of skin: Secondary | ICD-10-CM | POA: Diagnosis not present

## 2021-12-17 DIAGNOSIS — L814 Other melanin hyperpigmentation: Secondary | ICD-10-CM | POA: Diagnosis not present

## 2021-12-17 DIAGNOSIS — C44319 Basal cell carcinoma of skin of other parts of face: Secondary | ICD-10-CM | POA: Diagnosis not present

## 2021-12-17 NOTE — Telephone Encounter (Signed)
  Follow up Call-     12/14/2021    8:06 AM  Call back number  Post procedure Call Back phone  # [phone]  Permission to leave phone message Yes     Patient questions:  Do you have a fever, pain , or abdominal swelling? No. Pain Score  0 *  Have you tolerated food without any problems? Yes.    Have you been able to return to your normal activities? Yes.    Do you have any questions about your discharge instructions: Diet   No. Medications  No. Follow up visit  No.  Do you have questions or concerns about your Care? No.  Actions: * If pain score is 4 or above: No action needed, pain <4.

## 2021-12-18 NOTE — Telephone Encounter (Signed)
Please refill as per office routine med refill policy (all routine meds to be refilled for 3 mo or monthly (per pt preference) up to one year from last visit, then month to month grace period for 3 mo, then further med refills will have to be denied) ? ?

## 2021-12-21 ENCOUNTER — Encounter: Payer: Self-pay | Admitting: Gastroenterology

## 2022-01-06 ENCOUNTER — Other Ambulatory Visit: Payer: Self-pay | Admitting: Internal Medicine

## 2022-01-06 DIAGNOSIS — M109 Gout, unspecified: Secondary | ICD-10-CM

## 2022-01-07 NOTE — Telephone Encounter (Signed)
Please refill as per office routine med refill policy (all routine meds to be refilled for 3 mo or monthly (per pt preference) up to one year from last visit, then month to month grace period for 3 mo, then further med refills will have to be denied) ? ?

## 2022-01-14 ENCOUNTER — Ambulatory Visit: Payer: Self-pay | Admitting: Licensed Clinical Social Worker

## 2022-01-14 NOTE — Patient Outreach (Signed)
  Care Coordination   01/14/2022 Name: Luis Myers MRN: [id number] DOB: [birth year]   Care Coordination Outreach Attempts:  Contact was made with the patient today to offer care coordination services as a benefit of their health plan. The patient requested a return call on a later date.   Follow Up Plan:  Appointment scheduled 01/14/22 at 2:30  Encounter Outcome:  Pt. Scheduled  Care Coordination Interventions Activated:  No   Care Coordination Interventions:  No, not indicated    Casimer Lanius, [place] [phone]

## 2022-01-14 NOTE — Patient Outreach (Signed)
  Care Coordination   01/14/2022 Name: Corrigan Kretschmer MRN: [id number] DOB: 05-Jul-1945   Care Coordination Outreach Attempts:  Contact was made with the patient today to offer care coordination services as a benefit of their health plan. The patient declined the call and services.   Follow Up Plan:  No further outreach attempts will be made at this time.   Encounter Outcome:  Pt. Refused  Care Coordination Interventions Activated:  Yes  will inform office that patient needs AWV scheduled  Care Coordination Interventions:  Yes, provided    Casimer Lanius, [place] [phone]

## 2022-01-17 DIAGNOSIS — C4441 Basal cell carcinoma of skin of scalp and neck: Secondary | ICD-10-CM | POA: Diagnosis not present

## 2022-01-17 DIAGNOSIS — L570 Actinic keratosis: Secondary | ICD-10-CM | POA: Diagnosis not present

## 2022-01-17 DIAGNOSIS — Z85828 Personal history of other malignant neoplasm of skin: Secondary | ICD-10-CM | POA: Diagnosis not present

## 2022-01-17 DIAGNOSIS — Z08 Encounter for follow-up examination after completed treatment for malignant neoplasm: Secondary | ICD-10-CM | POA: Diagnosis not present

## 2022-01-17 DIAGNOSIS — X32XXXD Exposure to sunlight, subsequent encounter: Secondary | ICD-10-CM | POA: Diagnosis not present

## 2022-01-24 ENCOUNTER — Other Ambulatory Visit: Payer: Self-pay | Admitting: Internal Medicine

## 2022-01-25 NOTE — Telephone Encounter (Signed)
Please refill as per office routine med refill policy (all routine meds to be refilled for 3 mo or monthly (per pt preference) up to one year from last visit, then month to month grace period for 3 mo, then further med refills will have to be denied) ? ?

## 2022-03-06 ENCOUNTER — Encounter: Payer: Self-pay | Admitting: Internal Medicine

## 2022-03-06 ENCOUNTER — Ambulatory Visit (INDEPENDENT_AMBULATORY_CARE_PROVIDER_SITE_OTHER): Payer: Medicare Other | Admitting: Internal Medicine

## 2022-03-06 VITALS — BP 126/80 | HR 54 | Temp 98.4°F | Wt 229.0 lb

## 2022-03-06 DIAGNOSIS — E559 Vitamin D deficiency, unspecified: Secondary | ICD-10-CM | POA: Diagnosis not present

## 2022-03-06 DIAGNOSIS — M109 Gout, unspecified: Secondary | ICD-10-CM | POA: Diagnosis not present

## 2022-03-06 DIAGNOSIS — I10 Essential (primary) hypertension: Secondary | ICD-10-CM

## 2022-03-06 DIAGNOSIS — E538 Deficiency of other specified B group vitamins: Secondary | ICD-10-CM

## 2022-03-06 DIAGNOSIS — Z23 Encounter for immunization: Secondary | ICD-10-CM

## 2022-03-06 DIAGNOSIS — R7302 Impaired glucose tolerance (oral): Secondary | ICD-10-CM

## 2022-03-06 DIAGNOSIS — Z0001 Encounter for general adult medical examination with abnormal findings: Secondary | ICD-10-CM | POA: Diagnosis not present

## 2022-03-06 DIAGNOSIS — E7800 Pure hypercholesterolemia, unspecified: Secondary | ICD-10-CM

## 2022-03-06 DIAGNOSIS — Z0000 Encounter for general adult medical examination without abnormal findings: Secondary | ICD-10-CM

## 2022-03-06 DIAGNOSIS — Z125 Encounter for screening for malignant neoplasm of prostate: Secondary | ICD-10-CM

## 2022-03-06 DIAGNOSIS — Z86010 Personal history of colonic polyps: Secondary | ICD-10-CM | POA: Insufficient documentation

## 2022-03-06 LAB — CBC WITH DIFFERENTIAL/PLATELET
Basophils Absolute: 0 10*3/uL (ref 0.0–0.1)
Basophils Relative: 0.4 % (ref 0.0–3.0)
Eosinophils Absolute: 0.1 10*3/uL (ref 0.0–0.7)
Eosinophils Relative: 1.4 % (ref 0.0–5.0)
HCT: 41.7 % (ref 39.0–52.0)
Hemoglobin: 14 g/dL (ref 13.0–17.0)
Lymphocytes Relative: 34.9 % (ref 12.0–46.0)
Lymphs Abs: 1.8 10*3/uL (ref 0.7–4.0)
MCHC: 33.6 g/dL (ref 30.0–36.0)
MCV: 88.9 fl (ref 78.0–100.0)
Monocytes Absolute: 0.5 10*3/uL (ref 0.1–1.0)
Monocytes Relative: 10.3 % (ref 3.0–12.0)
Neutro Abs: 2.7 10*3/uL (ref 1.4–7.7)
Neutrophils Relative %: 53 % (ref 43.0–77.0)
Platelets: 144 10*3/uL — ABNORMAL LOW (ref 150.0–400.0)
RBC: 4.69 Mil/uL (ref 4.22–5.81)
RDW: 14.3 % (ref 11.5–15.5)
WBC: 5.1 10*3/uL (ref 4.0–10.5)

## 2022-03-06 LAB — URINALYSIS, ROUTINE W REFLEX MICROSCOPIC
Bilirubin Urine: NEGATIVE
Hgb urine dipstick: NEGATIVE
Ketones, ur: NEGATIVE
Leukocytes,Ua: NEGATIVE
Nitrite: NEGATIVE
Specific Gravity, Urine: 1.01 (ref 1.000–1.030)
Total Protein, Urine: NEGATIVE
Urine Glucose: NEGATIVE
Urobilinogen, UA: 0.2 (ref 0.0–1.0)
pH: 5.5 (ref 5.0–8.0)

## 2022-03-06 LAB — VITAMIN D 25 HYDROXY (VIT D DEFICIENCY, FRACTURES): VITD: 27.35 ng/mL — ABNORMAL LOW (ref 30.00–100.00)

## 2022-03-06 LAB — LIPID PANEL
Cholesterol: 131 mg/dL (ref 0–200)
HDL: 40.3 mg/dL (ref >39.00)
LDL Cholesterol: 64 mg/dL (ref 0–99)
NonHDL: 90.67
Total CHOL/HDL Ratio: 3
Triglycerides: 133 mg/dL (ref 0.0–149.0)
VLDL: 26.6 mg/dL (ref 0.0–40.0)

## 2022-03-06 LAB — HEMOGLOBIN A1C: Hgb A1c MFr Bld: 6.1 % (ref 4.6–6.5)

## 2022-03-06 LAB — PSA: PSA: 0.07 ng/mL — ABNORMAL LOW (ref 0.10–4.00)

## 2022-03-06 LAB — URIC ACID: Uric Acid, Serum: 8.7 mg/dL — ABNORMAL HIGH (ref 4.0–7.8)

## 2022-03-06 LAB — TSH: TSH: 1.98 u[IU]/mL (ref 0.35–5.50)

## 2022-03-06 LAB — VITAMIN B12: Vitamin B-12: 457 pg/mL (ref 211–911)

## 2022-03-06 NOTE — Assessment & Plan Note (Signed)
BP Readings from Last 3 Encounters:  03/06/22 126/80  12/14/21 110/70  09/11/21 (!) 142/78   Stable, pt to continue medical treatment lisinopril 40 mg qd

## 2022-03-06 NOTE — Assessment & Plan Note (Signed)
Last vitamin D Lab Results  Component Value Date   VD25OH 15.38 (L) 03/05/2021  low, to start oral replacement

## 2022-03-06 NOTE — Progress Notes (Signed)
Patient ID: Luis Myers, male   DOB: 1946-03-09, 76 y.o.   MRN: [id number]         Chief Complaint:: wellness exam and joint stiffness,        HPI:  Luis Myers is a 76 y.o. male here for wellness exam; due for flu shot; will have shingrix at pharmacy, o/w up to date               Also Pt denies chest pain, increased sob or doe, wheezing, orthopnea, PND, increased LE swelling, palpitations, dizziness or syncope.   Pt denies polydipsia, polyuria, or new focal neuro s/s.    Pt denies fever, wt loss, night sweats, loss of appetite, or other constitutional symptoms  Has hx of low Vit D and b12.     Wt Readings from Last 3 Encounters:  03/06/22 229 lb (103.9 kg)  12/14/21 229 lb (103.9 kg)  11/15/21 229 lb (103.9 kg)   BP Readings from Last 3 Encounters:  03/06/22 126/80  12/14/21 110/70  09/11/21 (!) 142/78   Immunization History  Administered Date(s) Administered   Fluad Quad(high Dose 65+) 02/23/2020, 03/05/2021, 03/06/2022   Influenza Whole 04/23/2007, 04/11/2008, 03/19/2009, 04/10/2010, 04/09/2012   Influenza, Seasonal, Injecte, Preservative Fre 03/10/2013   Influenza-Unspecified 03/10/2014   PFIZER(Purple Top)SARS-COV-2 Vaccination 08/12/2019, 09/14/2019   Pneumococcal Conjugate-13 07/13/2013   Pneumococcal Polysaccharide-23 03/19/2009, 09/16/2017   Td 08/09/2003   Tdap 07/13/2013   Zoster, Live 04/25/2008   There are no preventive care reminders to display for this patient.     Past Medical History:  Diagnosis Date   Acute gouty arthropathy 11/04/2008   ALLERGIC RHINITIS 10/07/2007   ANXIETY 03/04/2007   BENIGN PROSTATIC HYPERTROPHY 03/04/2007   CONJUNCTIVITIS, ACUTE, LEFT 11/27/2007   GLUCOSE INTOLERANCE 04/23/2007   GOUT 03/04/2007   HYPERLIPIDEMIA 03/04/2007   HYPERSOMNIA 02/20/2010   HYPERTENSION 03/04/2007   HYPOGONADISM 06/20/2010   HYPOTHYROIDISM 03/04/2007   Impaired fasting glucose 04/16/2007   Impaired glucose tolerance 01/01/2011   Morbid obesity ([place]) 03/04/2007    PERIPHERAL EDEMA 11/04/2008   Pure hypercholesterolemia 10/17/2008   RASH-NONVESICULAR 07/25/2009   SINUSITIS- ACUTE-NOS 05/16/2008   URI 11/27/2007   VITAMIN D DEFICIENCY 06/20/2010   Past Surgical History:  Procedure Laterality Date   COLONOSCOPY  11/11/2011   Dr.Patterson   HERNIA REPAIR     URETER SURGERY  06/10/1976   Stretched    reports that he has never smoked. He has never used smokeless tobacco. He reports that he does not drink alcohol and does not use drugs. family history includes Diabetes in an other family member; Heart disease in his brother and father; Lung cancer in his father. Allergies  Allergen Reactions   Atorvastatin Other (See Comments)    Muscle pain   Niacin Other (See Comments)    Tachycardia rate 160 beats / minute   Simvastatin Other (See Comments)    Muscle pain   Allopurinol Rash   Current Outpatient Medications on File Prior to Visit  Medication Sig Dispense Refill   Boswellia-Glucosamine-Vit D (OSTEO BI-FLEX ONE PER DAY PO) Take by mouth.     Cholecalciferol (VITAMIN D) 50 MCG (2000 UT) tablet Take 1 tablet (2,000 Units total) by mouth daily. 90 tablet 3   colchicine 0.6 MG tablet TAKE 1 TABLET BY MOUTH  DAILY 90 tablet 0   Cyanocobalamin (VITAMIN B-12 PO) Take by mouth.     dutasteride (AVODART) 0.5 MG capsule Take 1 capsule (0.5 mg total) by mouth daily. 90 capsule 3  indomethacin (INDOCIN) 50 MG capsule Take 1 capsule (50 mg total) by mouth 3 (three) times daily as needed. 90 capsule 2   levothyroxine (SYNTHROID) 100 MCG tablet TAKE 1 TABLET BY MOUTH  DAILY 100 tablet 2   lisinopril (ZESTRIL) 40 MG tablet TAKE 1 TABLET BY MOUTH  DAILY 100 tablet 2   rosuvastatin (CRESTOR) 10 MG tablet TAKE 1 TABLET BY MOUTH DAILY 100 tablet 2   tamsulosin (FLOMAX) 0.4 MG CAPS capsule TAKE 1 CAPSULE BY MOUTH  DAILY 100 capsule 2   No current facility-administered medications on file prior to visit.        ROS:  All others reviewed and negative.  Objective         PE:  BP 126/80 (BP Location: Left Arm, Patient Position: Sitting, Cuff Size: Normal)   Pulse (!) 54   Temp 98.4 F (36.9 C) (Oral)   Wt 229 lb (103.9 kg)   SpO2 99%   BMI 33.82 kg/m                 Constitutional: Pt appears in NAD               HENT: Head: NCAT.                Right Ear: External ear normal.                 Left Ear: External ear normal.                Eyes: . Pupils are equal, round, and reactive to light. Conjunctivae and EOM are normal               Nose: without d/c or deformity               Neck: Neck supple. Gross normal ROM               Cardiovascular: Normal rate and regular rhythm.                 Pulmonary/Chest: Effort normal and breath sounds without rales or wheezing.                Abd:  Soft, NT, ND, + BS, no organomegaly               Neurological: Pt is alert. At baseline orientation, motor grossly intact               Skin: Skin is warm. No rashes, no other new lesions, LE edema - none               Psychiatric: Pt behavior is normal without agitation   Micro: none  Cardiac tracings I have personally interpreted today:  none  Pertinent Radiological findings (summarize): none   Lab Results  Component Value Date   WBC 5.5 03/05/2021   HGB 14.4 03/05/2021   HCT 43.2 03/05/2021   PLT 138.0 (L) 03/05/2021   GLUCOSE 94 03/05/2021   CHOL 129 03/05/2021   TRIG 239.0 (H) 03/05/2021   HDL 40.80 03/05/2021   LDLDIRECT 60.0 03/05/2021   LDLCALC 58 01/26/2020   ALT 11 03/05/2021   AST 12 03/05/2021   NA 140 03/05/2021   K 4.3 03/05/2021   CL 106 03/05/2021   CREATININE 1.37 03/05/2021   BUN 13 03/05/2021   CO2 24 03/05/2021   TSH 1.25 03/05/2021   PSA 0.14 03/05/2021   HGBA1C 6.1 03/05/2021   MICROALBUR <0.7 07/19/2014  Assessment/Plan:  Luis Myers is a 76 y.o. White or Caucasian [1] male with  has a past medical history of Acute gouty arthropathy (11/04/2008), ALLERGIC RHINITIS (10/07/2007), ANXIETY (03/04/2007), BENIGN  PROSTATIC HYPERTROPHY (03/04/2007), CONJUNCTIVITIS, ACUTE, LEFT (11/27/2007), GLUCOSE INTOLERANCE (04/23/2007), GOUT (03/04/2007), HYPERLIPIDEMIA (03/04/2007), HYPERSOMNIA (02/20/2010), HYPERTENSION (03/04/2007), HYPOGONADISM (06/20/2010), HYPOTHYROIDISM (03/04/2007), Impaired fasting glucose (04/16/2007), Impaired glucose tolerance (01/01/2011), Morbid obesity ([place]) (03/04/2007), PERIPHERAL EDEMA (11/04/2008), Pure hypercholesterolemia (10/17/2008), RASH-NONVESICULAR (07/25/2009), SINUSITIS- ACUTE-NOS (05/16/2008), URI (11/27/2007), and VITAMIN D DEFICIENCY (06/20/2010).  B12 deficiency Lab Results  Component Value Date   VITAMINB12 169 (L) 03/05/2021   Low, to start oral replacement - b12 1000 mcg qd   Vitamin D deficiency Last vitamin D Lab Results  Component Value Date   VD25OH 15.38 (L) 03/05/2021  low, to start oral replacement   Encounter for well adult exam with abnormal findings Age and sex appropriate education and counseling updated with regular exercise and diet Referrals for preventative services - none needed Immunizations addressed - for flu shot today, for shingrx at pharmacy Smoking counseling  - none needed Evidence for depression or other mood disorder - none significant Most recent labs reviewed. I have personally reviewed and have noted: 1) the patient's medical and social history 2) The patient's current medications and supplements 3) The patient's height, weight, and BMI have been recorded in the chart   Essential hypertension BP Readings from Last 3 Encounters:  03/06/22 126/80  12/14/21 110/70  09/11/21 (!) 142/78   Stable, pt to continue medical treatment lisinopril 40 mg qd   Impaired glucose tolerance Lab Results  Component Value Date   HGBA1C 6.1 03/05/2021   Stable, pt to continue current medical treatment  - diet, wt control, excercise   Pure hypercholesterolemia Lab Results  Component Value Date   LDLCALC 58 01/26/2020   Stable, pt to continue  current statin crestor 10 mg qd  Followup: Return in about 1 year (around 03/07/2023).  Cathlean Cower, MD 03/06/2022 1:02 PM [facility]

## 2022-03-06 NOTE — Assessment & Plan Note (Signed)
Lab Results  Component Value Date   HGBA1C 6.1 03/05/2021   Stable, pt to continue current medical treatment  - diet, wt control, excercise

## 2022-03-06 NOTE — Assessment & Plan Note (Signed)
Age and sex appropriate education and counseling updated with regular exercise and diet Referrals for preventative services - none needed Immunizations addressed - for flu shot today, for shingrx at pharmacy Smoking counseling  - none needed Evidence for depression or other mood disorder - none significant Most recent labs reviewed. I have personally reviewed and have noted: 1) the patient's medical and social history 2) The patient's current medications and supplements 3) The patient's height, weight, and BMI have been recorded in the chart

## 2022-03-06 NOTE — Assessment & Plan Note (Signed)
Lab Results  Component Value Date   VITAMINB12 169 (L) 03/05/2021   Low, to start oral replacement - b12 1000 mcg qd

## 2022-03-06 NOTE — Patient Instructions (Addendum)
Please have your Shingrix (shingles) shots done at your local pharmacy.  You had the flu shot today  Please continue all other medications as before, and refills have been done if requested.  Please have the pharmacy call with any other refills you may need.  Please continue your efforts at being more active, low cholesterol diet, and weight control.  You are otherwise up to date with prevention measures today.  Please keep your appointments with your specialists as you may have planned  Please go to the LAB at the blood drawing area for the tests to be done  You will be contacted by phone if any changes need to be made immediately.  Otherwise, you will receive a letter about your results with an explanation, but please check with MyChart first.  Please remember to sign up for MyChart if you have not done so, as this will be important to you in the future with finding out test results, communicating by private email, and scheduling acute appointments online when needed.  Please make an Appointment to return for your 1 year visit, or sooner if needed, with Lab testing by Appointment as well, to be done about 3-5 days before at the [facility] (so this is for TWO appointments - please see the scheduling desk as you leave)

## 2022-03-06 NOTE — Addendum Note (Signed)
Addended by: Jacobo Forest on: 03/06/2022 11:00 AM   Modules accepted: Orders

## 2022-03-06 NOTE — Assessment & Plan Note (Signed)
Lab Results  Component Value Date   LDLCALC 58 01/26/2020   Stable, pt to continue current statin crestor 10 mg qd

## 2022-03-14 DIAGNOSIS — C4441 Basal cell carcinoma of skin of scalp and neck: Secondary | ICD-10-CM | POA: Diagnosis not present

## 2022-03-14 DIAGNOSIS — X32XXXD Exposure to sunlight, subsequent encounter: Secondary | ICD-10-CM | POA: Diagnosis not present

## 2022-03-14 DIAGNOSIS — L570 Actinic keratosis: Secondary | ICD-10-CM | POA: Diagnosis not present

## 2022-04-01 ENCOUNTER — Other Ambulatory Visit: Payer: Self-pay | Admitting: Internal Medicine

## 2022-04-01 DIAGNOSIS — M109 Gout, unspecified: Secondary | ICD-10-CM

## 2022-07-12 ENCOUNTER — Other Ambulatory Visit: Payer: Self-pay | Admitting: Internal Medicine

## 2022-07-13 NOTE — Telephone Encounter (Signed)
Please refill as per office routine med refill policy (all routine meds to be refilled for 3 mo or monthly (per pt preference) up to one year from last visit, then month to month grace period for 3 mo, then further med refills will have to be denied) ? ?

## 2022-08-01 ENCOUNTER — Telehealth: Payer: Self-pay

## 2022-08-01 NOTE — Telephone Encounter (Signed)
Called patient to schedule Medicare Annual Wellness Visit (AWV). Unable to reach patient.  Phone line continues to be busy  Last date of AWV: NO HX OF AWV, eligible 11/08/20  Please schedule an appointment at any time with [facility].  Norton Blizzard, [place] ([facility])  [place] [facility] [phone]

## 2022-09-07 ENCOUNTER — Other Ambulatory Visit: Payer: Self-pay | Admitting: Internal Medicine

## 2022-09-19 ENCOUNTER — Other Ambulatory Visit: Payer: Self-pay | Admitting: Internal Medicine

## 2022-10-15 ENCOUNTER — Other Ambulatory Visit: Payer: Self-pay | Admitting: Internal Medicine

## 2022-10-21 DIAGNOSIS — H25813 Combined forms of age-related cataract, bilateral: Secondary | ICD-10-CM | POA: Diagnosis not present

## 2023-02-17 NOTE — Progress Notes (Unsigned)
   Rubin Payor, PhD, LAT, ATC acting as a scribe for Clementeen Graham, MD.  Luis Myers is a 77 y.o. male who presents to [facility] today for ankle pain. Pt was previously seen by Dr. Denyse Amass on 10/20/20 for L elbow/forearm pain.  Today, pt c/o ankle pain x ***. Pt locates pain to ***  Ankle swelling: Aggravates: Treatments tried:  Pertinent review of systems: ***  Relevant historical information: ***   Exam:  There were no vitals taken for this visit. General: Well Developed, well nourished, and in no acute distress.   MSK: ***    Lab and Radiology Results No results found for this or any previous visit (from the past 72 hour(s)). No results found.     Assessment and Plan: 77 y.o. male with ***   PDMP not reviewed this encounter. No orders of the defined types were placed in this encounter.  No orders of the defined types were placed in this encounter.    Discussed warning signs or symptoms. Please see discharge instructions. Patient expresses understanding.   ***

## 2023-02-18 ENCOUNTER — Ambulatory Visit (INDEPENDENT_AMBULATORY_CARE_PROVIDER_SITE_OTHER): Payer: Medicare Other

## 2023-02-18 ENCOUNTER — Encounter: Payer: Self-pay | Admitting: Family Medicine

## 2023-02-18 ENCOUNTER — Other Ambulatory Visit: Payer: Self-pay

## 2023-02-18 ENCOUNTER — Ambulatory Visit: Payer: Medicare Other | Admitting: Family Medicine

## 2023-02-18 VITALS — BP 160/94 | HR 74 | Ht 69.0 in | Wt 226.0 lb

## 2023-02-18 DIAGNOSIS — M25571 Pain in right ankle and joints of right foot: Secondary | ICD-10-CM

## 2023-02-18 DIAGNOSIS — G8929 Other chronic pain: Secondary | ICD-10-CM | POA: Diagnosis not present

## 2023-02-18 DIAGNOSIS — M10071 Idiopathic gout, right ankle and foot: Secondary | ICD-10-CM | POA: Diagnosis not present

## 2023-02-18 DIAGNOSIS — M7989 Other specified soft tissue disorders: Secondary | ICD-10-CM | POA: Diagnosis not present

## 2023-02-18 DIAGNOSIS — M7731 Calcaneal spur, right foot: Secondary | ICD-10-CM | POA: Diagnosis not present

## 2023-02-18 LAB — COMPREHENSIVE METABOLIC PANEL
ALT: 13 U/L (ref 0–53)
AST: 15 U/L (ref 0–37)
Albumin: 4.3 g/dL (ref 3.5–5.2)
Alkaline Phosphatase: 71 U/L (ref 39–117)
BUN: 16 mg/dL (ref 6–23)
CO2: 26 meq/L (ref 19–32)
Calcium: 10 mg/dL (ref 8.4–10.5)
Chloride: 104 meq/L (ref 96–112)
Creatinine, Ser: 1.33 mg/dL (ref 0.40–1.50)
GFR: 52.44 mL/min — ABNORMAL LOW (ref >60.00)
Glucose, Bld: 186 mg/dL — ABNORMAL HIGH (ref 70–99)
Potassium: 4.3 meq/L (ref 3.5–5.1)
Sodium: 139 meq/L (ref 135–145)
Total Bilirubin: 0.8 mg/dL (ref 0.2–1.2)
Total Protein: 7.6 g/dL (ref 6.0–8.3)

## 2023-02-18 LAB — URIC ACID: Uric Acid, Serum: 8 mg/dL — ABNORMAL HIGH (ref 4.0–7.8)

## 2023-02-18 NOTE — Patient Instructions (Addendum)
Thank you for coming in today.   Please get an Xray today before you leave   You received an injection today. Seek immediate medical attention if the joint becomes red, extremely painful, or is oozing fluid.   Please get labs today before you leave   Check back in 1 month

## 2023-02-19 NOTE — Progress Notes (Signed)
Uric acid is elevated.  Blood sugar is elevated and kidney function is stable.  If this injection does not work well I would recommend using gout medication long-term.

## 2023-03-03 NOTE — Progress Notes (Signed)
Right ankle x-ray shows soft tissue swelling.  No fractures.  No severe arthritis.

## 2023-03-12 ENCOUNTER — Encounter: Payer: Self-pay | Admitting: Internal Medicine

## 2023-03-12 ENCOUNTER — Ambulatory Visit (INDEPENDENT_AMBULATORY_CARE_PROVIDER_SITE_OTHER): Payer: Medicare Other | Admitting: Internal Medicine

## 2023-03-12 VITALS — BP 120/84 | HR 87 | Temp 98.2°F | Ht 69.0 in | Wt 222.2 lb

## 2023-03-12 DIAGNOSIS — E559 Vitamin D deficiency, unspecified: Secondary | ICD-10-CM | POA: Diagnosis not present

## 2023-03-12 DIAGNOSIS — E538 Deficiency of other specified B group vitamins: Secondary | ICD-10-CM | POA: Diagnosis not present

## 2023-03-12 DIAGNOSIS — Z125 Encounter for screening for malignant neoplasm of prostate: Secondary | ICD-10-CM

## 2023-03-12 DIAGNOSIS — E7800 Pure hypercholesterolemia, unspecified: Secondary | ICD-10-CM | POA: Diagnosis not present

## 2023-03-12 DIAGNOSIS — Z0001 Encounter for general adult medical examination with abnormal findings: Secondary | ICD-10-CM

## 2023-03-12 DIAGNOSIS — Z23 Encounter for immunization: Secondary | ICD-10-CM | POA: Diagnosis not present

## 2023-03-12 DIAGNOSIS — E039 Hypothyroidism, unspecified: Secondary | ICD-10-CM | POA: Diagnosis not present

## 2023-03-12 DIAGNOSIS — I10 Essential (primary) hypertension: Secondary | ICD-10-CM | POA: Diagnosis not present

## 2023-03-12 DIAGNOSIS — R7302 Impaired glucose tolerance (oral): Secondary | ICD-10-CM

## 2023-03-12 DIAGNOSIS — K219 Gastro-esophageal reflux disease without esophagitis: Secondary | ICD-10-CM

## 2023-03-12 LAB — URINALYSIS, ROUTINE W REFLEX MICROSCOPIC
Bilirubin Urine: NEGATIVE
Hgb urine dipstick: NEGATIVE
Ketones, ur: NEGATIVE
Leukocytes,Ua: NEGATIVE
Nitrite: NEGATIVE
RBC / HPF: NONE SEEN (ref 0–?)
Specific Gravity, Urine: 1.02 (ref 1.000–1.030)
Total Protein, Urine: NEGATIVE
Urine Glucose: NEGATIVE
Urobilinogen, UA: 0.2 (ref 0.0–1.0)
pH: 6 (ref 5.0–8.0)

## 2023-03-12 LAB — BASIC METABOLIC PANEL
BUN: 17 mg/dL (ref 6–23)
CO2: 27 meq/L (ref 19–32)
Calcium: 9.9 mg/dL (ref 8.4–10.5)
Chloride: 105 meq/L (ref 96–112)
Creatinine, Ser: 1.34 mg/dL (ref 0.40–1.50)
GFR: 51.95 mL/min — ABNORMAL LOW (ref >60.00)
Glucose, Bld: 103 mg/dL — ABNORMAL HIGH (ref 70–99)
Potassium: 4.7 meq/L (ref 3.5–5.1)
Sodium: 141 meq/L (ref 135–145)

## 2023-03-12 LAB — CBC WITH DIFFERENTIAL/PLATELET
Basophils Absolute: 0 10*3/uL (ref 0.0–0.1)
Basophils Relative: 0.3 % (ref 0.0–3.0)
Eosinophils Absolute: 0.1 10*3/uL (ref 0.0–0.7)
Eosinophils Relative: 1.2 % (ref 0.0–5.0)
HCT: 44.5 % (ref 39.0–52.0)
Hemoglobin: 14.4 g/dL (ref 13.0–17.0)
Lymphocytes Relative: 31.7 % (ref 12.0–46.0)
Lymphs Abs: 1.9 10*3/uL (ref 0.7–4.0)
MCHC: 32.4 g/dL (ref 30.0–36.0)
MCV: 89.8 fL (ref 78.0–100.0)
Monocytes Absolute: 0.6 10*3/uL (ref 0.1–1.0)
Monocytes Relative: 9.1 % (ref 3.0–12.0)
Neutro Abs: 3.5 10*3/uL (ref 1.4–7.7)
Neutrophils Relative %: 57.7 % (ref 43.0–77.0)
Platelets: 126 10*3/uL — ABNORMAL LOW (ref 150.0–400.0)
RBC: 4.96 Mil/uL (ref 4.22–5.81)
RDW: 14.9 % (ref 11.5–15.5)
WBC: 6.1 10*3/uL (ref 4.0–10.5)

## 2023-03-12 LAB — MICROALBUMIN / CREATININE URINE RATIO
Creatinine,U: 116.6 mg/dL
Microalb Creat Ratio: 1.5 mg/g (ref 0.0–30.0)
Microalb, Ur: 1.7 mg/dL (ref 0.0–1.9)

## 2023-03-12 LAB — HEPATIC FUNCTION PANEL
ALT: 15 U/L (ref 0–53)
AST: 15 U/L (ref 0–37)
Albumin: 4.4 g/dL (ref 3.5–5.2)
Alkaline Phosphatase: 60 U/L (ref 39–117)
Bilirubin, Direct: 0.2 mg/dL (ref 0.0–0.3)
Total Bilirubin: 1.2 mg/dL (ref 0.2–1.2)
Total Protein: 7.3 g/dL (ref 6.0–8.3)

## 2023-03-12 LAB — PSA: PSA: 0.23 ng/mL (ref 0.10–4.00)

## 2023-03-12 LAB — LIPID PANEL
Cholesterol: 145 mg/dL (ref 0–200)
HDL: 48 mg/dL (ref >39.00)
LDL Cholesterol: 62 mg/dL (ref 0–99)
NonHDL: 97.05
Total CHOL/HDL Ratio: 3
Triglycerides: 175 mg/dL — ABNORMAL HIGH (ref 0.0–149.0)
VLDL: 35 mg/dL (ref 0.0–40.0)

## 2023-03-12 LAB — HEMOGLOBIN A1C: Hgb A1c MFr Bld: 6.3 % (ref 4.6–6.5)

## 2023-03-12 LAB — TSH: TSH: 0.44 u[IU]/mL (ref 0.35–5.50)

## 2023-03-12 LAB — VITAMIN D 25 HYDROXY (VIT D DEFICIENCY, FRACTURES): VITD: 21.53 ng/mL — ABNORMAL LOW (ref 30.00–100.00)

## 2023-03-12 LAB — VITAMIN B12: Vitamin B-12: 300 pg/mL (ref 211–911)

## 2023-03-12 NOTE — Progress Notes (Signed)
Patient ID: Luis Myers, male   DOB: Jan 22, 1946, 77 y.o.   MRN: [id number]         Chief Complaint:: wellness exam and gerd, hld, htn, low vit d, hyperglycemia, low b12, low thyroid       HPI:  Luis Myers is a 77 y.o. male here for wellness exam; for shingrix at pharmacy, o/w up to date                        Also has had worsening relfux more severe at night.  Denies worsening, abd pain, dysphagia, n/v, bowel change or blood. Pt denies chest pain, increased sob or doe, wheezing, orthopnea, PND, increased LE swelling, palpitations, dizziness or syncope.   Pt denies polydipsia, polyuria, or new focal neuro s/s.    Pt denies fever, wt loss, night sweats, loss of appetite, or other constitutional symptoms   Lost 7 lbs since last yr. Retired at [employer].  Has seen sport med recenlty for persistent right ankle pain, improved with cortisone.    Wt Readings from Last 3 Encounters:  03/12/23 222 lb 3.2 oz (100.8 kg)  02/18/23 226 lb (102.5 kg)  03/06/22 229 lb (103.9 kg)   BP Readings from Last 3 Encounters:  03/12/23 120/84  02/18/23 (!) 160/94  03/06/22 126/80   Immunization History  Administered Date(s) Administered   Fluad Quad(high Dose 65+) 02/23/2020, 03/05/2021, 03/06/2022   Fluad Trivalent(High Dose 65+) 03/12/2023   Influenza Whole 04/23/2007, 04/11/2008, 03/19/2009, 04/10/2010, 04/09/2012   Influenza, Seasonal, Injecte, Preservative Fre 03/10/2013   Influenza-Unspecified 03/10/2014   PFIZER(Purple Top)SARS-COV-2 Vaccination 08/12/2019, 09/14/2019   Pneumococcal Conjugate-13 07/13/2013   Pneumococcal Polysaccharide-23 03/19/2009, 09/16/2017   Td 08/09/2003   Tdap 07/13/2013   Zoster, Live 04/25/2008   Health Maintenance Due  Topic Date Due   Medicare Annual Wellness (AWV)  Never done   Zoster Vaccines- Shingrix (1 of 2) 01/13/1998      Past Medical History:  Diagnosis Date   Acute gouty arthropathy 11/04/2008   ALLERGIC RHINITIS 10/07/2007   ANXIETY 03/04/2007   BENIGN  PROSTATIC HYPERTROPHY 03/04/2007   CONJUNCTIVITIS, ACUTE, LEFT 11/27/2007   GLUCOSE INTOLERANCE 04/23/2007   GOUT 03/04/2007   HYPERLIPIDEMIA 03/04/2007   HYPERSOMNIA 02/20/2010   HYPERTENSION 03/04/2007   HYPOGONADISM 06/20/2010   HYPOTHYROIDISM 03/04/2007   Impaired fasting glucose 04/16/2007   Impaired glucose tolerance 01/01/2011   Morbid obesity (HCC) 03/04/2007   PERIPHERAL EDEMA 11/04/2008   Pure hypercholesterolemia 10/17/2008   RASH-NONVESICULAR 07/25/2009   SINUSITIS- ACUTE-NOS 05/16/2008   URI 11/27/2007   VITAMIN D DEFICIENCY 06/20/2010   Past Surgical History:  Procedure Laterality Date   COLONOSCOPY  11/11/2011   Dr.Patterson   HERNIA REPAIR     URETER SURGERY  06/10/1976   Stretched    reports that he has never smoked. He has never used smokeless tobacco. He reports that he does not drink alcohol and does not use drugs. family history includes Diabetes in an other family member; Heart disease in his brother and father; Lung cancer in his father. Allergies  Allergen Reactions   Atorvastatin Other (See Comments)    Muscle pain   Niacin Other (See Comments)    Tachycardia rate 160 beats / minute   Simvastatin Other (See Comments)    Muscle pain   Allopurinol Rash   Current Outpatient Medications on File Prior to Visit  Medication Sig Dispense Refill   Boswellia-Glucosamine-Vit D (OSTEO BI-FLEX ONE PER DAY PO) Take by mouth.  Cholecalciferol (VITAMIN D) 50 MCG (2000 UT) tablet Take 1 tablet (2,000 Units total) by mouth daily. 90 tablet 3   colchicine 0.6 MG tablet TAKE 1 TABLET BY MOUTH DAILY 100 tablet 3   Cyanocobalamin (VITAMIN B-12 PO) Take by mouth.     dutasteride (AVODART) 0.5 MG capsule TAKE 1 CAPSULE BY MOUTH DAILY 60 capsule 5   indomethacin (INDOCIN) 50 MG capsule Take 1 capsule (50 mg total) by mouth 3 (three) times daily as needed. 90 capsule 2   levothyroxine (SYNTHROID) 100 MCG tablet TAKE 1 TABLET BY MOUTH DAILY 100 tablet 2   lisinopril (ZESTRIL) 40 MG  tablet TAKE 1 TABLET BY MOUTH DAILY 100 tablet 2   rosuvastatin (CRESTOR) 10 MG tablet TAKE 1 TABLET BY MOUTH DAILY 100 tablet 2   tamsulosin (FLOMAX) 0.4 MG CAPS capsule TAKE 1 CAPSULE BY MOUTH DAILY 100 capsule 2   No current facility-administered medications on file prior to visit.        ROS:  All others reviewed and negative.  Objective        PE:  BP 120/84 (BP Location: Left Arm, Patient Position: Sitting, Cuff Size: Normal)   Pulse 87   Temp 98.2 F (36.8 C) (Oral)   Ht 5\' 9"  (1.753 m)   Wt 222 lb 3.2 oz (100.8 kg)   SpO2 94%   BMI 32.81 kg/m                 Constitutional: Pt appears in NAD               HENT: Head: NCAT.                Right Ear: External ear normal.                 Left Ear: External ear normal.                Eyes: . Pupils are equal, round, and reactive to light. Conjunctivae and EOM are normal               Nose: without d/c or deformity               Neck: Neck supple. Gross normal ROM               Cardiovascular: Normal rate and regular rhythm.                 Pulmonary/Chest: Effort normal and breath sounds without rales or wheezing.                Abd:  Soft, NT, ND, + BS, no organomegaly               Neurological: Pt is alert. At baseline orientation, motor grossly intact               Skin: Skin is warm. No rashes, no other new lesions, LE edema - none               Psychiatric: Pt behavior is normal without agitation   Micro: none  Cardiac tracings I have personally interpreted today:  none  Pertinent Radiological findings (summarize): none   Lab Results  Component Value Date   WBC 6.1 03/12/2023   HGB 14.4 03/12/2023   HCT 44.5 03/12/2023   PLT 126.0 (L) 03/12/2023   GLUCOSE 103 (H) 03/12/2023   CHOL 145 03/12/2023   TRIG 175.0 (H) 03/12/2023   HDL 48.00 03/12/2023  LDLDIRECT 60.0 03/05/2021   LDLCALC 62 03/12/2023   ALT 15 03/12/2023   AST 15 03/12/2023   NA 141 03/12/2023   K 4.7 03/12/2023   CL 105 03/12/2023    CREATININE 1.34 03/12/2023   BUN 17 03/12/2023   CO2 27 03/12/2023   TSH 0.44 03/12/2023   PSA 0.23 03/12/2023   HGBA1C 6.3 03/12/2023   MICROALBUR 1.7 03/12/2023   Assessment/Plan:  Luis Myers is a 77 y.o. White or Caucasian [1] male with  has a past medical history of Acute gouty arthropathy (11/04/2008), ALLERGIC RHINITIS (10/07/2007), ANXIETY (03/04/2007), BENIGN PROSTATIC HYPERTROPHY (03/04/2007), CONJUNCTIVITIS, ACUTE, LEFT (11/27/2007), GLUCOSE INTOLERANCE (04/23/2007), GOUT (03/04/2007), HYPERLIPIDEMIA (03/04/2007), HYPERSOMNIA (02/20/2010), HYPERTENSION (03/04/2007), HYPOGONADISM (06/20/2010), HYPOTHYROIDISM (03/04/2007), Impaired fasting glucose (04/16/2007), Impaired glucose tolerance (01/01/2011), Morbid obesity (HCC) (03/04/2007), PERIPHERAL EDEMA (11/04/2008), Pure hypercholesterolemia (10/17/2008), RASH-NONVESICULAR (07/25/2009), SINUSITIS- ACUTE-NOS (05/16/2008), URI (11/27/2007), and VITAMIN D DEFICIENCY (06/20/2010).  Encounter for well adult exam with abnormal findings Age and sex appropriate education and counseling updated with regular exercise and diet Referrals for preventative services - none needed Immunizations addressed - for shignrix at pharmacy Smoking counseling  - none needed Evidence for depression or other mood disorder - none significant Most recent labs reviewed. I have personally reviewed and have noted: 1) the patient's medical and social history 2) The patient's current medications and supplements 3) The patient's height, weight, and BMI have been recorded in the chart   Hypothyroidism Lab Results  Component Value Date   TSH 0.44 03/12/2023   Stable, pt to continue levothyroxine 100 mcg qd   Hyperlipidemia Lab Results  Component Value Date   LDLCALC 62 03/12/2023   Stable, pt to continue current statin crestor 10 qd  GERD (gastroesophageal reflux disease) With new recent worsening - for protonix 40 qd  Pure hypercholesterolemia Lab Results  Component  Value Date   LDLCALC 62 03/12/2023   Stable, pt to continue current statin crestor 10 qd   Essential hypertension BP Readings from Last 3 Encounters:  03/12/23 120/84  02/18/23 (!) 160/94  03/06/22 126/80   Stable, pt to continue medical treatment lisinopril 40 qd   Vitamin D deficiency Last vitamin D Lab Results  Component Value Date   VD25OH 21.53 (L) 03/12/2023   Low, to start oral replacement   Impaired glucose tolerance Lab Results  Component Value Date   HGBA1C 6.3 03/12/2023   Stable, pt to continue current medical treatment  - diet, wt control   B12 deficiency Lab Results  Component Value Date   VITAMINB12 300 03/12/2023   Stable, cont oral replacement - b12 1000 mcg qd  Followup: Return in about 1 year (around 03/11/2024).  Oliver Barre, MD 03/16/2023 1:29 PM [facility]

## 2023-03-12 NOTE — Patient Instructions (Addendum)
Please have your Shingrix (shingles) shots done at your local pharmacy.  Please take all new medication as prescribed- the protonix 40 mg per day  Please continue all other medications as before, and refills have been done if requested.  Please have the pharmacy call with any other refills you may need.  Please continue your efforts at being more active, low cholesterol diet, and weight control.  You are otherwise up to date with prevention measures today.  Please keep your appointments with your specialists as you may have planned  Please go to the LAB at the blood drawing area for the tests to be done  You will be contacted by phone if any changes need to be made immediately.  Otherwise, you will receive a letter about your results with an explanation, but please check with MyChart first.  Please make an Appointment to return for your 1 year visit, or sooner if needed, with Lab testing by Appointment as well, to be done about 3-5 days before at the FIRST FLOOR Lab (so this is for TWO appointments - please see the scheduling desk as you leave)

## 2023-03-16 ENCOUNTER — Encounter: Payer: Self-pay | Admitting: Internal Medicine

## 2023-03-16 DIAGNOSIS — K219 Gastro-esophageal reflux disease without esophagitis: Secondary | ICD-10-CM | POA: Insufficient documentation

## 2023-03-16 NOTE — Assessment & Plan Note (Signed)
Lab Results  Component Value Date   HGBA1C 6.3 03/12/2023   Stable, pt to continue current medical treatment  - diet, wt control

## 2023-03-16 NOTE — Assessment & Plan Note (Signed)
Lab Results  Component Value Date   LDLCALC 62 03/12/2023   Stable, pt to continue current statin crestor 10 qd

## 2023-03-16 NOTE — Assessment & Plan Note (Signed)
BP Readings from Last 3 Encounters:  03/12/23 120/84  02/18/23 (!) 160/94  03/06/22 126/80   Stable, pt to continue medical treatment lisinopril 40 qd

## 2023-03-16 NOTE — Assessment & Plan Note (Signed)
Lab Results  Component Value Date   VITAMINB12 300 03/12/2023   Stable, cont oral replacement - b12 1000 mcg qd

## 2023-03-16 NOTE — Assessment & Plan Note (Signed)
With new recent worsening - for protonix 40 qd

## 2023-03-16 NOTE — Assessment & Plan Note (Signed)
Last vitamin D Lab Results  Component Value Date   VD25OH 21.53 (L) 03/12/2023   Low, to start oral replacement

## 2023-03-16 NOTE — Assessment & Plan Note (Signed)
Age and sex appropriate education and counseling updated with regular exercise and diet Referrals for preventative services - none needed Immunizations addressed - for shignrix at pharmacy Smoking counseling  - none needed Evidence for depression or other mood disorder - none significant Most recent labs reviewed. I have personally reviewed and have noted: 1) the patient's medical and social history 2) The patient's current medications and supplements 3) The patient's height, weight, and BMI have been recorded in the chart

## 2023-03-16 NOTE — Assessment & Plan Note (Signed)
Lab Results  Component Value Date   TSH 0.44 03/12/2023   Stable, pt to continue levothyroxine 100 mcg qd

## 2023-03-18 DIAGNOSIS — H18413 Arcus senilis, bilateral: Secondary | ICD-10-CM | POA: Diagnosis not present

## 2023-03-18 DIAGNOSIS — H2513 Age-related nuclear cataract, bilateral: Secondary | ICD-10-CM | POA: Diagnosis not present

## 2023-03-18 DIAGNOSIS — H2511 Age-related nuclear cataract, right eye: Secondary | ICD-10-CM | POA: Diagnosis not present

## 2023-03-18 DIAGNOSIS — H35372 Puckering of macula, left eye: Secondary | ICD-10-CM | POA: Diagnosis not present

## 2023-03-19 DIAGNOSIS — H2513 Age-related nuclear cataract, bilateral: Secondary | ICD-10-CM | POA: Diagnosis not present

## 2023-03-19 DIAGNOSIS — H43813 Vitreous degeneration, bilateral: Secondary | ICD-10-CM | POA: Diagnosis not present

## 2023-03-19 DIAGNOSIS — H33331 Multiple defects of retina without detachment, right eye: Secondary | ICD-10-CM | POA: Diagnosis not present

## 2023-03-19 DIAGNOSIS — H35372 Puckering of macula, left eye: Secondary | ICD-10-CM | POA: Diagnosis not present

## 2023-03-25 ENCOUNTER — Ambulatory Visit: Payer: Medicare Other | Admitting: Family Medicine

## 2023-03-25 VITALS — BP 138/82 | HR 75 | Ht 69.0 in | Wt 225.0 lb

## 2023-03-25 DIAGNOSIS — M25571 Pain in right ankle and joints of right foot: Secondary | ICD-10-CM

## 2023-03-25 DIAGNOSIS — M109 Gout, unspecified: Secondary | ICD-10-CM | POA: Diagnosis not present

## 2023-03-25 DIAGNOSIS — G8929 Other chronic pain: Secondary | ICD-10-CM | POA: Diagnosis not present

## 2023-03-25 MED ORDER — FEBUXOSTAT 40 MG PO TABS: 40.0000 mg | ORAL_TABLET | Freq: Every day | ORAL | 2 refills | Status: DC

## 2023-03-25 NOTE — Patient Instructions (Addendum)
Thank you for coming in today.   Start Uloric (Febuxostat) daily to prevent gout.   I will want to recheck your kidney and uric acid levels in about 1-2 months.   I plan to get an MRI on that ankle if you pain is coming back over the next month.  I can order that with a phone call or mychart message.    Recheck in about 1 month.

## 2023-03-25 NOTE — Progress Notes (Signed)
Rubin Payor, PhD, LAT, ATC acting as a scribe for Clementeen Graham, MD.  Caliph Borowiak is a 77 y.o. male who presents to [facility] at [facility] today for 31-month f/u R ankle pain. Pt was last seen by Dr. Denyse Amass on 02/18/23 and was given a R lateral ankle steroid injection and labs were obtained.   Today, pt reports R ankle was feeling great, but started hurting again about 8 days ago. Increased pain w/ stairs and PF/DF. Pain located along the lateral aspect of his R ankle. Pain is not nearly as severe as previously.  Dx testing: 02/18/23 R ankle XR & labs  Pertinent review of systems: No fevers or chills  Relevant historical information: Intolerant to allopurinol causing a rash.   Exam:  BP 138/82   Pulse 75   Ht 5\' 9"  (1.753 m)   Wt 225 lb (102.1 kg)   SpO2 98%   BMI 33.23 kg/m  General: Well Developed, well nourished, and in no acute distress.   MSK: Right ankle normal-appearing normal motion.    Lab and Radiology Results  EXAM: RIGHT ANKLE - COMPLETE 3 VIEW   COMPARISON:  None Available.   FINDINGS: There is no evidence of fracture, dislocation, or subluxation. There is no evidence of arthropathy or other focal bone abnormality. Plantar calcaneal spur noted.   Soft tissue swelling noted at the lateral malleolus.   IMPRESSION: Soft tissue swelling. Plantar calcaneal spur. No acute osseous abnormality identified.     Electronically Signed   By: Layla Maw M.D.   On: 02/28/2023 22:37   I, Clementeen Graham, personally (independently) visualized and performed the interpretation of the images attached in this note.  Lab Results  Component Value Date   LABURIC 8.0 (H) 02/18/2023      Chemistry      Component Value Date/Time   NA 141 03/12/2023 1050   K 4.7 03/12/2023 1050   CL 105 03/12/2023 1050   CO2 27 03/12/2023 1050   BUN 17 03/12/2023 1050   BUN 12 09/02/2016 0000   CREATININE 1.34 03/12/2023 1050   GLU 101 09/02/2016 0000       Component Value Date/Time   CALCIUM 9.9 03/12/2023 1050   ALKPHOS 60 03/12/2023 1050   AST 15 03/12/2023 1050   ALT 15 03/12/2023 1050   BILITOT 1.2 03/12/2023 1050          Assessment and Plan: 77 y.o. male with right ankle pain thought to be perhaps exacerbation of degenerative changes not well-visualized on x-ray or gout exacerbation.  He had a great response to steroid injection but his pain is returning.  After discussion we will start Uloric for uric acid suppression while continuing colchicine.  I believe gout could be a factor here.  He is intolerant to allopurinol so we could use Uloric.  We talked about the heart risk of this medication which I think is acceptable in the setting of persistent gout flares.  Recheck in 1 month.  If his symptoms are worsening would proceed to MRI of the ankle to evaluate pain more thoroughly.  We could order that with a phone call or MyChart message before recheck.   PDMP not reviewed this encounter. No orders of the defined types were placed in this encounter.  Meds ordered this encounter  Medications   febuxostat (ULORIC) 40 MG tablet    Sig: Take 1 tablet (40 mg total) by mouth daily.    Dispense:  90 tablet  Refill:  2     Discussed warning signs or symptoms. Please see discharge instructions. Patient expresses understanding.   The above documentation has been reviewed and is accurate and complete Clementeen Graham, M.D.

## 2023-03-28 ENCOUNTER — Telehealth: Payer: Self-pay

## 2023-03-28 NOTE — Telephone Encounter (Signed)
Prior Auth required for Febuxostat 40 mg tab  [email or web address] KEY [id number]

## 2023-03-29 ENCOUNTER — Other Ambulatory Visit: Payer: Self-pay | Admitting: Internal Medicine

## 2023-03-29 DIAGNOSIS — M109 Gout, unspecified: Secondary | ICD-10-CM

## 2023-03-31 ENCOUNTER — Other Ambulatory Visit: Payer: Self-pay

## 2023-03-31 NOTE — Telephone Encounter (Signed)
Prior Authorization initiated for Febuxostat 40MG  tablets via [email or web address] KEY: [id number]

## 2023-04-01 NOTE — Telephone Encounter (Signed)
Prior Auth for Febuxostat 40MG  tablets APPROVED PA# [id number] Valid: 03/31/23-06/09/24

## 2023-04-04 DIAGNOSIS — H31001 Unspecified chorioretinal scars, right eye: Secondary | ICD-10-CM | POA: Diagnosis not present

## 2023-04-04 DIAGNOSIS — H33331 Multiple defects of retina without detachment, right eye: Secondary | ICD-10-CM | POA: Diagnosis not present

## 2023-04-04 DIAGNOSIS — H43391 Other vitreous opacities, right eye: Secondary | ICD-10-CM | POA: Diagnosis not present

## 2023-04-04 DIAGNOSIS — H35372 Puckering of macula, left eye: Secondary | ICD-10-CM | POA: Diagnosis not present

## 2023-04-04 DIAGNOSIS — H43813 Vitreous degeneration, bilateral: Secondary | ICD-10-CM | POA: Diagnosis not present

## 2023-04-04 DIAGNOSIS — H2513 Age-related nuclear cataract, bilateral: Secondary | ICD-10-CM | POA: Diagnosis not present

## 2023-04-08 ENCOUNTER — Encounter: Payer: Self-pay | Admitting: Family Medicine

## 2023-05-13 ENCOUNTER — Encounter: Payer: Self-pay | Admitting: Family Medicine

## 2023-05-13 DIAGNOSIS — M109 Gout, unspecified: Secondary | ICD-10-CM

## 2023-05-14 NOTE — Telephone Encounter (Signed)
Forwarding to Dr. Corey to review and advise.  

## 2023-05-15 ENCOUNTER — Other Ambulatory Visit: Payer: Medicare Other

## 2023-05-15 DIAGNOSIS — M109 Gout, unspecified: Secondary | ICD-10-CM

## 2023-05-15 LAB — URIC ACID: Uric Acid, Serum: 5 mg/dL (ref 4.0–7.8)

## 2023-05-16 MED ORDER — FEBUXOSTAT 40 MG PO TABS: 40.0000 mg | ORAL_TABLET | Freq: Every day | ORAL | 3 refills | Status: DC

## 2023-05-16 NOTE — Telephone Encounter (Signed)
Uloric refilled

## 2023-05-16 NOTE — Progress Notes (Signed)
Uric acid level was normal.  Plan to continue current dose of Uloric.  If you are feeling good you do not need to take colchicine any longer unless you are having a flare.  I have refilled the medicine.

## 2023-05-16 NOTE — Addendum Note (Signed)
Addended by: Rodolph Bong on: 05/16/2023 07:34 AM   Modules accepted: Orders

## 2023-05-22 ENCOUNTER — Other Ambulatory Visit: Payer: Self-pay | Admitting: Internal Medicine

## 2023-05-22 ENCOUNTER — Other Ambulatory Visit: Payer: Self-pay

## 2023-05-26 DIAGNOSIS — H3581 Retinal edema: Secondary | ICD-10-CM | POA: Diagnosis not present

## 2023-05-26 DIAGNOSIS — H2512 Age-related nuclear cataract, left eye: Secondary | ICD-10-CM | POA: Diagnosis not present

## 2023-05-26 DIAGNOSIS — H35372 Puckering of macula, left eye: Secondary | ICD-10-CM | POA: Diagnosis not present

## 2023-06-06 DIAGNOSIS — H35372 Puckering of macula, left eye: Secondary | ICD-10-CM | POA: Diagnosis not present

## 2023-06-06 DIAGNOSIS — H3581 Retinal edema: Secondary | ICD-10-CM | POA: Diagnosis not present

## 2023-06-07 ENCOUNTER — Other Ambulatory Visit: Payer: Self-pay | Admitting: Internal Medicine

## 2023-06-09 ENCOUNTER — Other Ambulatory Visit: Payer: Self-pay

## 2023-06-23 DIAGNOSIS — H33331 Multiple defects of retina without detachment, right eye: Secondary | ICD-10-CM | POA: Diagnosis not present

## 2023-06-23 DIAGNOSIS — H2511 Age-related nuclear cataract, right eye: Secondary | ICD-10-CM | POA: Diagnosis not present

## 2023-06-23 DIAGNOSIS — H43311 Vitreous membranes and strands, right eye: Secondary | ICD-10-CM | POA: Diagnosis not present

## 2023-06-23 DIAGNOSIS — H33321 Round hole, right eye: Secondary | ICD-10-CM | POA: Diagnosis not present

## 2023-06-23 DIAGNOSIS — H43391 Other vitreous opacities, right eye: Secondary | ICD-10-CM | POA: Diagnosis not present

## 2023-06-28 ENCOUNTER — Other Ambulatory Visit: Payer: Self-pay | Admitting: Internal Medicine

## 2023-06-30 ENCOUNTER — Other Ambulatory Visit: Payer: Self-pay

## 2023-07-01 DIAGNOSIS — H35372 Puckering of macula, left eye: Secondary | ICD-10-CM | POA: Diagnosis not present

## 2023-07-01 DIAGNOSIS — H3581 Retinal edema: Secondary | ICD-10-CM | POA: Diagnosis not present

## 2023-07-29 ENCOUNTER — Other Ambulatory Visit: Payer: Self-pay

## 2023-07-29 ENCOUNTER — Other Ambulatory Visit: Payer: Self-pay | Admitting: Internal Medicine

## 2023-11-13 ENCOUNTER — Ambulatory Visit

## 2023-11-20 ENCOUNTER — Emergency Department (HOSPITAL_COMMUNITY)
Admission: EM | Admit: 2023-11-20 | Discharge: 2023-11-21 | Disposition: A | Discharge status: Home / Self Care | Attending: Emergency Medicine | Admitting: Emergency Medicine

## 2023-11-20 ENCOUNTER — Encounter (HOSPITAL_COMMUNITY): Payer: Self-pay

## 2023-11-20 ENCOUNTER — Emergency Department (HOSPITAL_COMMUNITY)

## 2023-11-20 ENCOUNTER — Other Ambulatory Visit: Payer: Self-pay

## 2023-11-20 DIAGNOSIS — E039 Hypothyroidism, unspecified: Secondary | ICD-10-CM | POA: Diagnosis not present

## 2023-11-20 DIAGNOSIS — I517 Cardiomegaly: Secondary | ICD-10-CM | POA: Diagnosis not present

## 2023-11-20 DIAGNOSIS — I10 Essential (primary) hypertension: Secondary | ICD-10-CM | POA: Diagnosis not present

## 2023-11-20 DIAGNOSIS — R0789 Other chest pain: Secondary | ICD-10-CM | POA: Diagnosis not present

## 2023-11-20 DIAGNOSIS — Z79890 Hormone replacement therapy (postmenopausal): Secondary | ICD-10-CM | POA: Insufficient documentation

## 2023-11-20 DIAGNOSIS — Z79899 Other long term (current) drug therapy: Secondary | ICD-10-CM | POA: Diagnosis not present

## 2023-11-20 DIAGNOSIS — I700 Atherosclerosis of aorta: Secondary | ICD-10-CM | POA: Diagnosis not present

## 2023-11-20 LAB — CBC
HCT: 44.3 % (ref 39.0–52.0)
Hemoglobin: 14.4 g/dL (ref 13.0–17.0)
MCH: 29.4 pg (ref 26.0–34.0)
MCHC: 32.5 g/dL (ref 30.0–36.0)
MCV: 90.4 fL (ref 80.0–100.0)
Platelets: 149 10*3/uL — ABNORMAL LOW (ref 150–400)
RBC: 4.9 MIL/uL (ref 4.22–5.81)
RDW: 14.1 % (ref 11.5–15.5)
WBC: 6.7 10*3/uL (ref 4.0–10.5)
nRBC: 0 % (ref 0.0–0.2)

## 2023-11-20 NOTE — ED Notes (Signed)
 Patient being transported to [facility]

## 2023-11-20 NOTE — ED Triage Notes (Signed)
 Pt has been feeling unwell x 2 days, worsening today, States he is not having chest pain but having some chest pressure. He does not mention any shortness of breath but noticed today he blood pressure was higher than normal. He is otherwise stable and pleasant in triage at this time.

## 2023-11-21 LAB — BASIC METABOLIC PANEL WITH GFR
Anion gap: 12 (ref 5–15)
BUN: 10 mg/dL (ref 8–23)
CO2: 23 mmol/L (ref 22–32)
Calcium: 9.6 mg/dL (ref 8.9–10.3)
Chloride: 103 mmol/L (ref 98–111)
Creatinine, Ser: 1.33 mg/dL — ABNORMAL HIGH (ref 0.61–1.24)
GFR, Estimated: 56 mL/min — ABNORMAL LOW (ref >60)
Glucose, Bld: 100 mg/dL — ABNORMAL HIGH (ref 70–99)
Potassium: 3.8 mmol/L (ref 3.5–5.1)
Sodium: 138 mmol/L (ref 135–145)

## 2023-11-21 LAB — TROPONIN I (HIGH SENSITIVITY)
Troponin I (High Sensitivity): 8 ng/L (ref <18)
Troponin I (High Sensitivity): 7 ng/L (ref <18)

## 2023-11-21 NOTE — ED Provider Notes (Signed)
 [place] [facility] DEPARTMENT AT [facility] Provider Note   CSN: [id number] Arrival date & time: 11/20/23  2307     Patient presents with: Chest Pain   Luis Myers is a 78 y.o. male.   HPI     This is a [age]-year-old male with a history of hypertension, hyperlipidemia who presents with chest tightness.  Patient reports he has had several episodes of intermittent chest tightness that has lasted several minutes.  He noted his blood pressure was elevated at home 190/140.  He states that normally it is normal.  He denies any shortness of breath or recent illnesses.  No leg swelling.  Denies any history of heart disease in the past.  Currently asymptomatic.  Prior to Admission medications   Medication Sig Start Date End Date Taking? Authorizing Provider  Cholecalciferol (VITAMIN D ) 50 MCG (2000 UT) tablet Take 1 tablet (2,000 Units total) by mouth daily. 02/23/20   Roslyn Coombe, MD  colchicine  0.6 MG tablet TAKE 1 TABLET BY MOUTH DAILY 03/31/23   Roslyn Coombe, MD  Cyanocobalamin  (VITAMIN B-12 PO) Take by mouth.    [provider]  dutasteride  (AVODART ) 0.5 MG capsule TAKE 1 CAPSULE BY MOUTH DAILY 06/09/23   Roslyn Coombe, MD  febuxostat  (ULORIC ) 40 MG tablet Take 1 tablet (40 mg total) by mouth daily. 05/16/23   Syliva Even, MD  indomethacin  (INDOCIN ) 50 MG capsule TAKE 1 CAPSULE BY MOUTH 3  TIMES DAILY AS NEEDED 05/22/23   Roslyn Coombe, MD  levothyroxine  (SYNTHROID ) 100 MCG tablet TAKE 1 TABLET BY MOUTH DAILY 07/29/23   Roslyn Coombe, MD  lisinopril  (ZESTRIL ) 40 MG tablet TAKE 1 TABLET BY MOUTH DAILY 06/09/23   Roslyn Coombe, MD  rosuvastatin  (CRESTOR ) 10 MG tablet TAKE 1 TABLET BY MOUTH DAILY 06/30/23   Roslyn Coombe, MD  tamsulosin  (FLOMAX ) 0.4 MG CAPS capsule TAKE 1 CAPSULE BY MOUTH DAILY 06/09/23   Roslyn Coombe, MD    Allergies: Atorvastatin, Niacin, Simvastatin, and Allopurinol    Review of Systems  Constitutional:  Negative for fever.  Respiratory:   Positive for chest tightness. Negative for shortness of breath.   Cardiovascular:  Negative for chest pain.  Gastrointestinal:  Negative for abdominal pain.  All other systems reviewed and are negative.   Updated Vital Signs BP (!) 162/118   Pulse 76   Temp (!) 97.4 F (36.3 C)   Resp 14   SpO2 100%   Physical Exam Vitals and nursing note reviewed.  Constitutional:      Appearance: He is well-developed. He is obese. He is not ill-appearing.  HENT:     Head: Normocephalic and atraumatic.   Eyes:     Pupils: Pupils are equal, round, and reactive to light.    Cardiovascular:     Rate and Rhythm: Normal rate and regular rhythm.     Heart sounds: Normal heart sounds. No murmur heard. Pulmonary:     Effort: Pulmonary effort is normal. No respiratory distress.     Breath sounds: Normal breath sounds. No wheezing.  Abdominal:     General: Bowel sounds are normal.     Palpations: Abdomen is soft.     Tenderness: There is no abdominal tenderness. There is no rebound.   Musculoskeletal:     Cervical back: Neck supple.  Lymphadenopathy:     Cervical: No cervical adenopathy.   Skin:    General: Skin is warm and dry.   Neurological:  General: No focal deficit present.     Mental Status: He is alert and oriented to person, place, and time.     (all labs ordered are listed, but only abnormal results are displayed) Labs Reviewed  BASIC METABOLIC PANEL WITH GFR - Abnormal; Notable for the following components:      Result Value   Glucose, Bld 100 (*)    Creatinine, Ser 1.33 (*)    GFR, Estimated 56 (*)    All other components within normal limits  CBC - Abnormal; Notable for the following components:   Platelets 149 (*)    All other components within normal limits  TROPONIN I (HIGH SENSITIVITY)  TROPONIN I (HIGH SENSITIVITY)    EKG: EKG Interpretation Date/Time:  Thursday November 20 2023 23:14:50 EDT Ventricular Rate:  80 PR Interval:  184 QRS Duration:  98 QT  Interval:  382 QTC Calculation: 440 R Axis:   -26  Text Interpretation: Normal sinus rhythm Normal ECG No previous ECGs available Confirmed by Donita Furrow ([id number]) on 11/20/2023 11:45:37 PM  Radiology: Lenell Query Chest 2 View Result Date: 11/20/2023 CLINICAL DATA:  Chest pressure EXAM: CHEST - 2 VIEW COMPARISON:  02/20/2010 FINDINGS: Mild cardiomegaly. No acute airspace disease, pleural effusion or pneumothorax. Aortic atherosclerosis. IMPRESSION: No active cardiopulmonary disease. Mild cardiomegaly. Electronically Signed   By: Esmeralda Hedge M.D.   On: 11/20/2023 23:44     Procedures   Medications Ordered in the ED - No data to display                                  Medical Decision Making Amount and/or Complexity of Data Reviewed Labs: ordered. Radiology: ordered.   This patient presents to the ED for concern of chest pain, this involves an extensive number of treatment options, and is a complaint that carries with it a high risk of complications and morbidity.  I considered the following differential and admission for this acute, potentially life threatening condition.  The differential diagnosis includes ACS, PE, pneumothorax, pneumonia, hypertensive urgency, hypertensive emergency  MDM:    This is a 78 year old male who presents with concerns for high blood pressure and chest discomfort.  He describes some chest pressure and noted his blood pressure at home was high.  He is currently asymptomatic.  Blood pressure has downtrended since he was triaged to 162/118.  Ultimately it downtrended to the 140s over 70s.  EKG is normal without acute ischemia or arrhythmia.  Troponin x 2 negative.  Basic lab work is reassuring.  Low suspicion for PE as patient is not hypoxic or tachycardic.  No recent cough or fever to suggest pneumonia and chest x-ray is clear.  Patient has remained asymptomatic in the emergency department.  He does have some risk factors for heart disease including hypertension  and hyperlipidemia.  We discussed close cardiology follow-up as patient has not had any recent ischemic evaluation.  We discussed strict return precautions.  (Labs, imaging, consults)  Labs: I Ordered, and personally interpreted labs.  The pertinent results include: CBC, BMP, troponin x 2  Imaging Studies ordered: I ordered imaging studies including chest x-ray I independently visualized and interpreted imaging. I agree with the radiologist interpretation  Additional history obtained from chart review.  External records from outside source obtained and reviewed including prior evaluations  Cardiac Monitoring: The patient was maintained on a cardiac monitor.  If on the cardiac monitor, I personally viewed and  interpreted the cardiac monitored which showed an underlying rhythm of: Sinus  Reevaluation: After the interventions noted above, I reevaluated the patient and found that they have :resolved  Social Determinants of Health:  lives independently  Disposition: Discharge  Co morbidities that complicate the patient evaluation  Past Medical History:  Diagnosis Date   Acute gouty arthropathy 11/04/2008   ALLERGIC RHINITIS 10/07/2007   ANXIETY 03/04/2007   BENIGN PROSTATIC HYPERTROPHY 03/04/2007   CONJUNCTIVITIS, ACUTE, LEFT 11/27/2007   GLUCOSE INTOLERANCE 04/23/2007   GOUT 03/04/2007   HYPERLIPIDEMIA 03/04/2007   HYPERSOMNIA 02/20/2010   HYPERTENSION 03/04/2007   HYPOGONADISM 06/20/2010   HYPOTHYROIDISM 03/04/2007   Impaired fasting glucose 04/16/2007   Impaired glucose tolerance 01/01/2011   Morbid obesity (HCC) 03/04/2007   PERIPHERAL EDEMA 11/04/2008   Pure hypercholesterolemia 10/17/2008   RASH-NONVESICULAR 07/25/2009   SINUSITIS- ACUTE-NOS 05/16/2008   URI 11/27/2007   VITAMIN D  DEFICIENCY 06/20/2010     Medicines No orders of the defined types were placed in this encounter.   I have reviewed the patients home medicines and have made adjustments as needed  Problem List / ED  Course: Problem List Items Addressed This Visit       Cardiovascular and Mediastinum   Essential hypertension   Relevant Orders   Ambulatory referral to Cardiology   Other Visit Diagnoses       Atypical chest pain    -  Primary   Relevant Orders   Ambulatory referral to Cardiology                Final diagnoses:  Atypical chest pain  Essential hypertension    ED Discharge Orders          Ordered    Ambulatory referral to Cardiology        11/21/23 0215               Rory Collard, MD 11/21/23 [phone]

## 2023-11-21 NOTE — Discharge Instructions (Signed)
 You were seen today with concerns for chest pain and high blood pressure.  Continue your blood pressure medications at home.  You were referred to cardiology.  If the frequency or character of your pain changes or worsens, you need to be reevaluated immediately.

## 2023-11-26 ENCOUNTER — Ambulatory Visit (INDEPENDENT_AMBULATORY_CARE_PROVIDER_SITE_OTHER)

## 2023-11-26 VITALS — BP 158/78 | HR 79 | Ht 69.0 in | Wt 227.0 lb

## 2023-11-26 DIAGNOSIS — Z0000 Encounter for general adult medical examination without abnormal findings: Secondary | ICD-10-CM | POA: Diagnosis not present

## 2023-11-26 NOTE — Progress Notes (Signed)
 Subjective:   Luis Myers is a 78 y.o. who presents for a Medicare Wellness preventive visit.  As a reminder, Annual Wellness Visits don't include a physical exam, and some assessments may be limited, especially if this visit is performed virtually. We may recommend an in-person follow-up visit with your provider if needed.  Visit Complete: In person  Persons Participating in Visit: Patient.  AWV Questionnaire: Yes: Patient Medicare AWV questionnaire was completed by the patient on 11/25/2023; I have confirmed that all information answered by patient is correct and no changes since this date.  Cardiac Risk Factors include: advanced age (>61men, >7 women);hypertension;dyslipidemia;male gender;obesity (BMI >30kg/m2)     Objective:    Today's Vitals   11/26/23 0845 11/26/23 0848  BP: (!) 158/78   Pulse: 79   SpO2: 97%   Weight: 227 lb (103 kg)   Height: 5' 9 (1.753 m)   PainSc:  0-No pain   Body mass index is 33.52 kg/m.     11/26/2023    8:45 AM 11/20/2023   11:17 PM  Advanced Directives  Does Patient Have a Medical Advance Directive? Yes No  Type of Estate agent of [place];Living will   Copy of Healthcare Power of Attorney in Chart? No - copy requested   Would patient like information on creating a medical advance directive? No - Patient declined No - Patient declined    Current Medications (verified) Outpatient Encounter Medications as of 11/26/2023  Medication Sig   Cholecalciferol (VITAMIN D ) 50 MCG (2000 UT) tablet Take 1 tablet (2,000 Units total) by mouth daily.   colchicine  0.6 MG tablet TAKE 1 TABLET BY MOUTH DAILY   Cyanocobalamin  (VITAMIN B-12 PO) Take by mouth.   dutasteride  (AVODART ) 0.5 MG capsule TAKE 1 CAPSULE BY MOUTH DAILY   febuxostat  (ULORIC ) 40 MG tablet Take 1 tablet (40 mg total) by mouth daily.   indomethacin  (INDOCIN ) 50 MG capsule TAKE 1 CAPSULE BY MOUTH 3  TIMES DAILY AS NEEDED   levothyroxine  (SYNTHROID ) 100 MCG  tablet TAKE 1 TABLET BY MOUTH DAILY   lisinopril  (ZESTRIL ) 40 MG tablet TAKE 1 TABLET BY MOUTH DAILY   rosuvastatin  (CRESTOR ) 10 MG tablet TAKE 1 TABLET BY MOUTH DAILY   tamsulosin  (FLOMAX ) 0.4 MG CAPS capsule TAKE 1 CAPSULE BY MOUTH DAILY   No facility-administered encounter medications on file as of 11/26/2023.    Allergies (verified) Atorvastatin, Niacin, Simvastatin, and Allopurinol   History: Past Medical History:  Diagnosis Date   Acute gouty arthropathy 11/04/2008   ALLERGIC RHINITIS 10/07/2007   ANXIETY 03/04/2007   BENIGN PROSTATIC HYPERTROPHY 03/04/2007   CONJUNCTIVITIS, ACUTE, LEFT 11/27/2007   GLUCOSE INTOLERANCE 04/23/2007   GOUT 03/04/2007   HYPERLIPIDEMIA 03/04/2007   HYPERSOMNIA 02/20/2010   HYPERTENSION 03/04/2007   HYPOGONADISM 06/20/2010   HYPOTHYROIDISM 03/04/2007   Impaired fasting glucose 04/16/2007   Impaired glucose tolerance 01/01/2011   Morbid obesity (HCC) 03/04/2007   PERIPHERAL EDEMA 11/04/2008   Pure hypercholesterolemia 10/17/2008   RASH-NONVESICULAR 07/25/2009   SINUSITIS- ACUTE-NOS 05/16/2008   URI 11/27/2007   VITAMIN D  DEFICIENCY 06/20/2010   Past Surgical History:  Procedure Laterality Date   COLONOSCOPY  11/11/2011   Dr.Patterson   HERNIA REPAIR     URETER SURGERY  06/10/1976   Stretched   Family History  Problem Relation Age of Onset   Lung cancer Father    Heart disease Father    Heart disease Brother    Diabetes Other    Colon cancer Neg Hx  Social History   Socioeconomic History   Marital status: Married    Spouse name: Not on file   Number of children: 1   Years of education: Not on file   Highest education level: 12th grade  Occupational History   Occupation: cust. service  Tobacco Use   Smoking status: Never   Smokeless tobacco: Never  Vaping Use   Vaping status: Never Used  Substance and Sexual Activity   Alcohol use: No   Drug use: No   Sexual activity: Yes  Other Topics Concern   Not on file  Social History Narrative    Not on file   Social Drivers of Health   Financial Resource Strain: Low Risk  (11/26/2023)   Overall Financial Resource Strain (CARDIA)    Difficulty of Paying Living Expenses: Not hard at all  Food Insecurity: No Food Insecurity (11/26/2023)   Hunger Vital Sign    Worried About Running Out of Food in the Last Year: Never true    Ran Out of Food in the Last Year: Never true  Transportation Needs: No Transportation Needs (11/26/2023)   PRAPARE - Administrator, Civil Service (Medical): No    Lack of Transportation (Non-Medical): No  Physical Activity: Insufficiently Active (11/26/2023)   Exercise Vital Sign    Days of Exercise per Week: 3 days    Minutes of Exercise per Session: 10 min  Stress: No Stress Concern Present (11/26/2023)   [employer] of Occupational Health - Occupational Stress Questionnaire    Feeling of Stress: Not at all  Social Connections: Socially Integrated (11/26/2023)   Social Connection and Isolation Panel    Frequency of Communication with Friends and Family: More than three times a week    Frequency of Social Gatherings with Friends and Family: More than three times a week    Attends Religious Services: More than 4 times per year    Active Member of [employer] or Organizations: Yes    Attends Engineer, structural: More than 4 times per year    Marital Status: Married    Tobacco Counseling Counseling given: No    Clinical Intake:  Pre-visit preparation completed: Yes  Pain : No/denies pain Pain Score: 0-No pain     BMI - recorded: 33.52 Nutritional Status: BMI > 30  Obese Nutritional Risks: None Diabetes: No  Lab Results  Component Value Date   HGBA1C 6.3 03/12/2023   HGBA1C 6.1 03/06/2022   HGBA1C 6.1 03/05/2021     How often do you need to have someone help you when you read instructions, pamphlets, or other written materials from your doctor or pharmacy?: 1 - Never  Interpreter Needed?: No  Information entered  by :: Kandy Orris, CMA   Activities of Daily Living     11/26/2023    8:50 AM 11/25/2023    2:05 PM  In your present state of health, do you have any difficulty performing the following activities:  Hearing? 0 0  Vision? 0 0  Difficulty concentrating or making decisions? 0 0  Walking or climbing stairs? 0   Dressing or bathing? 0 0  Doing errands, shopping? 0 0  Preparing Food and eating ? N N  Using the Toilet? N N  In the past six months, have you accidently leaked urine? N N  Do you have problems with loss of bowel control? N N  Managing your Medications? N N  Managing your Finances? N N  Housekeeping or managing your Housekeeping?  N     Patient Care Team: Roslyn Coombe, MD as PCP - General  I have updated your Care Teams any recent Medical Services you may have received from other providers in the past year.     Assessment:   This is a routine wellness examination for Luis Myers.  Hearing/Vision screen Hearing Screening - Comments:: Slight hearing concerns but declines referral to Audiologist at this time. Vision Screening - Comments:: Wears rx glasses - appt in 12/2023 w/[facility]    Goals Addressed               This Visit's Progress     Weight (lb) < 200 lb (90.7 kg) (pt-stated)   227 lb (103 kg)     Patient stated that he wants to lose weight (about 20lbs), manage his blood pressure, and stay active       Depression Screen     11/26/2023    8:52 AM 03/12/2023    9:53 AM 03/06/2022   10:42 AM 03/06/2022   10:22 AM 09/11/2021    1:59 PM 03/05/2021   10:18 AM 02/23/2020    1:55 PM  PHQ 2/9 Scores  PHQ - 2 Score 0 0 0 0 0 0 0  PHQ- 9 Score 2 0  0       Fall Risk     11/26/2023    8:52 AM 11/25/2023    2:05 PM 03/12/2023    9:53 AM 03/06/2022   10:42 AM 09/11/2021    1:59 PM  Fall Risk   Falls in the past year? 0 0 0 0 0  Number falls in past yr: 0  0 0 0  Injury with Fall? 0  0 0 0  Risk for fall due to : No Fall Risks  No Fall Risks    Follow up Falls  evaluation completed;Falls prevention discussed  Falls evaluation completed      MEDICARE RISK AT HOME:  Medicare Risk at Home Any stairs in or around the home?: Yes If so, are there any without handrails?: Yes Home free of loose throw rugs in walkways, pet beds, electrical cords, etc?: Yes Adequate lighting in your home to reduce risk of falls?: Yes Life alert?: No Use of a cane, walker or w/c?: No Grab bars in the bathroom?: Yes Shower chair or bench in shower?: No Elevated toilet seat or a handicapped toilet?: No  TIMED UP AND GO:  Was the test performed?  No  Cognitive Function: 6CIT completed        11/26/2023    8:55 AM  6CIT Screen  What Year? 0 points  What month? 0 points  What time? 0 points  Count back from 20 0 points  Months in reverse 0 points  Repeat phrase 0 points  Total Score 0 points    Immunizations Immunization History  Administered Date(s) Administered   Fluad Quad(high Dose 65+) 02/23/2020, 03/05/2021, 03/06/2022   Fluad Trivalent(High Dose 65+) 03/12/2023   Influenza Whole 04/23/2007, 04/11/2008, 03/19/2009, 04/10/2010, 04/09/2012   Influenza, Seasonal, Injecte, Preservative Fre 03/10/2013   Influenza-Unspecified 03/10/2014   PFIZER(Purple Top)SARS-COV-2 Vaccination 08/12/2019, 09/14/2019   Pneumococcal Conjugate-13 07/13/2013   Pneumococcal Polysaccharide-23 03/19/2009, 09/16/2017   Td 08/09/2003   Tdap 07/13/2013   Zoster, Live 04/25/2008    Screening Tests Health Maintenance  Topic Date Due   Zoster Vaccines- Shingrix (1 of 2) 01/13/1998   DTaP/Tdap/Td (3 - Td or Tdap) 07/14/2023   INFLUENZA VACCINE  01/09/2024   Medicare Annual Wellness (  AWV)  11/25/2024   Pneumococcal Vaccine: 50+ Years  Completed   Hepatitis C Screening  Completed   HPV VACCINES  Aged Out   Meningococcal B Vaccine  Aged Out   Colonoscopy  Discontinued   COVID-19 Vaccine  Discontinued    Health Maintenance  Health Maintenance Due  Topic Date Due    Zoster Vaccines- Shingrix (1 of 2) 01/13/1998   DTaP/Tdap/Td (3 - Td or Tdap) 07/14/2023   Health Maintenance Items Addressed:  Patient c/o slight hearing concerns but declines referral to Audiologist at this time.   Additional Screening:  Vision Screening: Recommended annual ophthalmology exams for early detection of glaucoma and other disorders of the eye. Would you like a referral to an eye doctor? No  Patient stated has an annual eye exam appt w/[facility] in 12/2023.  Dental Screening: Recommended annual dental exams for proper oral hygiene  Community Resource Referral / Chronic Care Management: CRR required this visit?  No   CCM required this visit?  No   Plan:    I have personally reviewed and noted the following in the patient's chart:   Medical and social history Use of alcohol, tobacco or illicit drugs  Current medications and supplements including opioid prescriptions. Patient is not currently taking opioid prescriptions. Functional ability and status Nutritional status Physical activity Advanced directives List of other physicians Hospitalizations, surgeries, and ER visits in previous 12 months Vitals Screenings to include cognitive, depression, and falls Referrals and appointments  In addition, I have reviewed and discussed with patient certain preventive protocols, quality metrics, and best practice recommendations. A written personalized care plan for preventive services as well as general preventive health recommendations were provided to patient.   Patria Bookbinder, CMA   11/26/2023   After Visit Summary: (In Person-Declined) Patient declined AVS at this time.  Notes: Nothing significant to report at this time.

## 2023-11-26 NOTE — Patient Instructions (Signed)
 [name] , Thank you for taking time out of your busy schedule to complete your Annual Wellness Visit with me. I enjoyed our conversation and look forward to speaking with you again next year. I, as well as your care team,  appreciate your ongoing commitment to your health goals. Please review the following plan we discussed and let me know if I can assist you in the future. Your Game plan/ To Do List    Follow up Visits: Next Medicare AWV with our clinical staff: 11/30/2024   Have you seen your provider in the last 6 months (3 months if uncontrolled diabetes)? Yes Next Office Visit with your provider: 11/28/2023  Clinician Recommendations:  Aim for 30 minutes of exercise or brisk walking, 6-8 glasses of water, and 5 servings of fruits and vegetables each day. Educated and advised on getting the Tdap (Tetenus) and Shingles vaccines at [facility].      This is a list of the screening recommended for you and due dates:  Health Maintenance  Topic Date Due   Zoster (Shingles) Vaccine (1 of 2) 01/13/1998   DTaP/Tdap/Td vaccine (3 - Td or Tdap) 07/14/2023   Flu Shot  01/09/2024   Medicare Annual Wellness Visit  11/25/2024   Pneumococcal Vaccine for age over 55  Completed   Hepatitis C Screening  Completed   HPV Vaccine  Aged Out   Meningitis B Vaccine  Aged Out   Colon Cancer Screening  Discontinued   COVID-19 Vaccine  Discontinued    Advanced directives: (Copy Requested) Please bring a copy of your health care power of attorney and living will to the office to be added to your chart at your convenience. You can mail to [facility] [address] [place], [place] [address] or email to [email or web address] Advance Care Planning is important because it:  [x]  Makes sure you receive the medical care that is consistent with your values, goals, and preferences  [x]  It provides guidance to your family and loved ones and reduces their decisional burden about whether or  not they are making the right decisions based on your wishes.  Follow the link provided in your after visit summary or read over the paperwork we have mailed to you to help you started getting your Advance Directives in place. If you need assistance in completing these, please reach out to us  so that we can help you!

## 2023-11-28 ENCOUNTER — Ambulatory Visit: Admitting: Internal Medicine

## 2023-11-28 ENCOUNTER — Ambulatory Visit: Payer: Self-pay | Admitting: Internal Medicine

## 2023-11-28 VITALS — BP 120/80 | HR 66 | Temp 97.7°F | Ht 69.0 in | Wt 223.5 lb

## 2023-11-28 DIAGNOSIS — Z0001 Encounter for general adult medical examination with abnormal findings: Secondary | ICD-10-CM

## 2023-11-28 DIAGNOSIS — Z125 Encounter for screening for malignant neoplasm of prostate: Secondary | ICD-10-CM

## 2023-11-28 DIAGNOSIS — R7302 Impaired glucose tolerance (oral): Secondary | ICD-10-CM

## 2023-11-28 DIAGNOSIS — Z0000 Encounter for general adult medical examination without abnormal findings: Secondary | ICD-10-CM

## 2023-11-28 DIAGNOSIS — R079 Chest pain, unspecified: Secondary | ICD-10-CM | POA: Diagnosis not present

## 2023-11-28 DIAGNOSIS — E7800 Pure hypercholesterolemia, unspecified: Secondary | ICD-10-CM

## 2023-11-28 DIAGNOSIS — E559 Vitamin D deficiency, unspecified: Secondary | ICD-10-CM | POA: Diagnosis not present

## 2023-11-28 DIAGNOSIS — E538 Deficiency of other specified B group vitamins: Secondary | ICD-10-CM | POA: Diagnosis not present

## 2023-11-28 LAB — HEMOGLOBIN A1C: Hgb A1c MFr Bld: 6.1 % (ref 4.6–6.5)

## 2023-11-28 LAB — PSA: PSA: 0.2 ng/mL (ref 0.10–4.00)

## 2023-11-28 LAB — LIPID PANEL
Cholesterol: 136 mg/dL (ref 0–200)
HDL: 42.3 mg/dL (ref >39.00)
LDL Cholesterol: 66 mg/dL (ref 0–99)
NonHDL: 94.13
Total CHOL/HDL Ratio: 3
Triglycerides: 142 mg/dL (ref 0.0–149.0)
VLDL: 28.4 mg/dL (ref 0.0–40.0)

## 2023-11-28 LAB — CBC WITH DIFFERENTIAL/PLATELET
Basophils Absolute: 0 10*3/uL (ref 0.0–0.1)
Basophils Relative: 0.5 % (ref 0.0–3.0)
Eosinophils Absolute: 0.1 10*3/uL (ref 0.0–0.7)
Eosinophils Relative: 1.7 % (ref 0.0–5.0)
HCT: 45 % (ref 39.0–52.0)
Hemoglobin: 14.9 g/dL (ref 13.0–17.0)
Lymphocytes Relative: 31.2 % (ref 12.0–46.0)
Lymphs Abs: 1.9 10*3/uL (ref 0.7–4.0)
MCHC: 33 g/dL (ref 30.0–36.0)
MCV: 88.3 fl (ref 78.0–100.0)
Monocytes Absolute: 0.5 10*3/uL (ref 0.1–1.0)
Monocytes Relative: 8.5 % (ref 3.0–12.0)
Neutro Abs: 3.5 10*3/uL (ref 1.4–7.7)
Neutrophils Relative %: 58.1 % (ref 43.0–77.0)
Platelets: 141 10*3/uL — ABNORMAL LOW (ref 150.0–400.0)
RBC: 5.1 Mil/uL (ref 4.22–5.81)
RDW: 14.7 % (ref 11.5–15.5)
WBC: 6 10*3/uL (ref 4.0–10.5)

## 2023-11-28 LAB — BASIC METABOLIC PANEL WITH GFR
BUN: 19 mg/dL (ref 6–23)
CO2: 28 meq/L (ref 19–32)
Calcium: 9.9 mg/dL (ref 8.4–10.5)
Chloride: 104 meq/L (ref 96–112)
Creatinine, Ser: 1.42 mg/dL (ref 0.40–1.50)
GFR: 48.21 mL/min — ABNORMAL LOW (ref >60.00)
Glucose, Bld: 92 mg/dL (ref 70–99)
Potassium: 3.8 meq/L (ref 3.5–5.1)
Sodium: 140 meq/L (ref 135–145)

## 2023-11-28 LAB — HEPATIC FUNCTION PANEL
ALT: 13 U/L (ref 0–53)
AST: 16 U/L (ref 0–37)
Albumin: 4.7 g/dL (ref 3.5–5.2)
Alkaline Phosphatase: 60 U/L (ref 39–117)
Bilirubin, Direct: 0.2 mg/dL (ref 0.0–0.3)
Total Bilirubin: 1.1 mg/dL (ref 0.2–1.2)
Total Protein: 7.3 g/dL (ref 6.0–8.3)

## 2023-11-28 LAB — URINALYSIS, ROUTINE W REFLEX MICROSCOPIC
Bilirubin Urine: NEGATIVE
Hgb urine dipstick: NEGATIVE
Ketones, ur: NEGATIVE
Leukocytes,Ua: NEGATIVE
Nitrite: NEGATIVE
Specific Gravity, Urine: 1.02 (ref 1.000–1.030)
Total Protein, Urine: NEGATIVE
Urine Glucose: NEGATIVE
Urobilinogen, UA: 0.2 (ref 0.0–1.0)
pH: 6 (ref 5.0–8.0)

## 2023-11-28 LAB — MICROALBUMIN / CREATININE URINE RATIO
Creatinine,U: 191 mg/dL
Microalb Creat Ratio: 14.8 mg/g (ref 0.0–30.0)
Microalb, Ur: 2.8 mg/dL — ABNORMAL HIGH (ref 0.0–1.9)

## 2023-11-28 LAB — VITAMIN D 25 HYDROXY (VIT D DEFICIENCY, FRACTURES): VITD: 25.88 ng/mL — ABNORMAL LOW (ref 30.00–100.00)

## 2023-11-28 LAB — VITAMIN B12: Vitamin B-12: 415 pg/mL (ref 211–911)

## 2023-11-28 LAB — TSH: TSH: 1.29 u[IU]/mL (ref 0.35–5.50)

## 2023-11-28 NOTE — Progress Notes (Unsigned)
 Patient ID: Luis Myers, male   DOB: 02/16/1946, 78 y.o.   MRN: [id number]         Chief Complaint:: wellness exam and No chief complaint on file.  ***       HPI:  Luis Myers  is a 78 y.o. male here for wellness exam                        Also***   Wt Readings from Last 3 Encounters:  11/28/23 223 lb 8 oz (101.4 kg)  11/26/23 227 lb (103 kg)  03/25/23 225 lb (102.1 kg)   BP Readings from Last 3 Encounters:  11/28/23 120/80  11/26/23 (!) 158/78  11/21/23 (!) 143/84   Immunization History  Administered Date(s) Administered   Fluad Quad(high Dose 65+) 02/23/2020, 03/05/2021, 03/06/2022   Fluad Trivalent(High Dose 65+) 03/12/2023   Influenza Whole 04/23/2007, 04/11/2008, 03/19/2009, 04/10/2010, 04/09/2012   Influenza, Seasonal, Injecte, Preservative Fre 03/10/2013   Influenza-Unspecified 03/10/2014   PFIZER(Purple Top)SARS-COV-2 Vaccination 08/12/2019, 09/14/2019   Pneumococcal Conjugate-13 07/13/2013   Pneumococcal Polysaccharide-23 03/19/2009, 09/16/2017   Td 08/09/2003   Tdap 07/13/2013   Zoster, Live 04/25/2008   Health Maintenance Due  Topic Date Due   Zoster Vaccines- Shingrix (1 of 2) 01/13/1998   DTaP/Tdap/Td (3 - Td or Tdap) 07/14/2023      Past Medical History:  Diagnosis Date   Acute gouty arthropathy 11/04/2008   ALLERGIC RHINITIS 10/07/2007   ANXIETY 03/04/2007   BENIGN PROSTATIC HYPERTROPHY 03/04/2007   CONJUNCTIVITIS, ACUTE, LEFT 11/27/2007   GLUCOSE INTOLERANCE 04/23/2007   GOUT 03/04/2007   HYPERLIPIDEMIA 03/04/2007   HYPERSOMNIA 02/20/2010   HYPERTENSION 03/04/2007   HYPOGONADISM 06/20/2010   HYPOTHYROIDISM 03/04/2007   Impaired fasting glucose 04/16/2007   Impaired glucose tolerance 01/01/2011   Morbid obesity (HCC) 03/04/2007   PERIPHERAL EDEMA 11/04/2008   Pure hypercholesterolemia 10/17/2008   RASH-NONVESICULAR 07/25/2009   SINUSITIS- ACUTE-NOS 05/16/2008   URI 11/27/2007   VITAMIN D  DEFICIENCY 06/20/2010   Past Surgical History:  Procedure  Laterality Date   COLONOSCOPY  11/11/2011   Dr.Patterson   HERNIA REPAIR     URETER SURGERY  06/10/1976   Stretched    reports that he has never smoked. He has never used smokeless tobacco. He reports that he does not drink alcohol and does not use drugs. family history includes Diabetes in an other family member; Heart disease in his brother and father; Lung cancer in his father. Allergies  Allergen Reactions   Atorvastatin Other (See Comments)    Muscle pain   Niacin Other (See Comments)    Tachycardia rate 160 beats / minute   Simvastatin Other (See Comments)    Muscle pain   Allopurinol Rash   Current Outpatient Medications on File Prior to Visit  Medication Sig Dispense Refill   Cholecalciferol (VITAMIN D ) 50 MCG (2000 UT) tablet Take 1 tablet (2,000 Units total) by mouth daily. 90 tablet 3   colchicine  0.6 MG tablet TAKE 1 TABLET BY MOUTH DAILY 100 tablet 2   Cyanocobalamin  (VITAMIN B-12 PO) Take by mouth.     dutasteride  (AVODART ) 0.5 MG capsule TAKE 1 CAPSULE BY MOUTH DAILY 100 capsule 2   febuxostat  (ULORIC ) 40 MG tablet Take 1 tablet (40 mg total) by mouth daily. 90 tablet 3   indomethacin  (INDOCIN ) 50 MG capsule TAKE 1 CAPSULE BY MOUTH 3  TIMES DAILY AS NEEDED 270 capsule 3   levothyroxine  (SYNTHROID ) 100 MCG tablet TAKE 1 TABLET BY MOUTH  DAILY 100 tablet 2   lisinopril  (ZESTRIL ) 40 MG tablet TAKE 1 TABLET BY MOUTH DAILY 100 tablet 2   rosuvastatin  (CRESTOR ) 10 MG tablet TAKE 1 TABLET BY MOUTH DAILY 100 tablet 2   tamsulosin  (FLOMAX ) 0.4 MG CAPS capsule TAKE 1 CAPSULE BY MOUTH DAILY 100 capsule 2   No current facility-administered medications on file prior to visit.        ROS:  All others reviewed and negative.  Objective        PE:  BP 120/80   Pulse 66   Temp 97.7 F (36.5 C) (Temporal)   Ht 5' 9 (1.753 m)   Wt 223 lb 8 oz (101.4 kg)   SpO2 97%   BMI 33.01 kg/m                 Constitutional: Pt appears in NAD               HENT: Head: NCAT.                 Right Ear: External ear normal.                 Left Ear: External ear normal.                Eyes: . Pupils are equal, round, and reactive to light. Conjunctivae and EOM are normal               Nose: without d/c or deformity               Neck: Neck supple. Gross normal ROM               Cardiovascular: Normal rate and regular rhythm.                 Pulmonary/Chest: Effort normal and breath sounds without rales or wheezing.                Abd:  Soft, NT, ND, + BS, no organomegaly               Neurological: Pt is alert. At baseline orientation, motor grossly intact               Skin: Skin is warm. No rashes, no other new lesions, LE edema - ***               Psychiatric: Pt behavior is normal without agitation   Micro: none  Cardiac tracings I have personally interpreted today:  none  Pertinent Radiological findings (summarize): none   Lab Results  Component Value Date   WBC 6.7 11/20/2023   HGB 14.4 11/20/2023   HCT 44.3 11/20/2023   PLT 149 (L) 11/20/2023   GLUCOSE 100 (H) 11/20/2023   CHOL 145 03/12/2023   TRIG 175.0 (H) 03/12/2023   HDL 48.00 03/12/2023   LDLDIRECT 60.0 03/05/2021   LDLCALC 62 03/12/2023   ALT 15 03/12/2023   AST 15 03/12/2023   NA 138 11/20/2023   K 3.8 11/20/2023   CL 103 11/20/2023   CREATININE 1.33 (H) 11/20/2023   BUN 10 11/20/2023   CO2 23 11/20/2023   TSH 0.44 03/12/2023   PSA 0.23 03/12/2023   HGBA1C 6.3 03/12/2023   MICROALBUR 1.7 03/12/2023   Assessment/Plan:  Luis Myers is a 78 y.o. White or Caucasian [1] male with  has a past medical history of Acute gouty arthropathy (11/04/2008), ALLERGIC RHINITIS (10/07/2007), ANXIETY (03/04/2007), BENIGN PROSTATIC HYPERTROPHY (03/04/2007), CONJUNCTIVITIS, ACUTE,  LEFT (11/27/2007), GLUCOSE INTOLERANCE (04/23/2007), GOUT (03/04/2007), HYPERLIPIDEMIA (03/04/2007), HYPERSOMNIA (02/20/2010), HYPERTENSION (03/04/2007), HYPOGONADISM (06/20/2010), HYPOTHYROIDISM (03/04/2007), Impaired fasting glucose  (04/16/2007), Impaired glucose tolerance (01/01/2011), Morbid obesity (HCC) (03/04/2007), PERIPHERAL EDEMA (11/04/2008), Pure hypercholesterolemia (10/17/2008), RASH-NONVESICULAR (07/25/2009), SINUSITIS- ACUTE-NOS (05/16/2008), URI (11/27/2007), and VITAMIN D  DEFICIENCY (06/20/2010).  No problem-specific Assessment & Plan notes found for this encounter.  Followup: No follow-ups on file.  Rosalia Colonel, MD 11/28/2023 10:48 AM [facility]

## 2023-11-28 NOTE — Progress Notes (Signed)
 The test results show that your current treatment is OK, as the tests are stable.  Please continue the same plan.  There is no other need for change of treatment or further evaluation based on these results, at this time.  thanks

## 2023-11-28 NOTE — Patient Instructions (Addendum)
 Please have your Shingrix (shingles) shots done at your local pharmacy., and the Tdap tetanus shot today  Please continue all other medications as before, and refills have been done if requested.  Please have the pharmacy call with any other refills you may need.  Please continue your efforts at being more active, low cholesterol diet, and weight control.  You are otherwise up to date with prevention measures today.  Please keep your appointments with your specialists as you may have planned  You will be contacted regarding the referral for: stress testing  Please go to the LAB at the blood drawing area for the tests to be done  You will be contacted by phone if any changes need to be made immediately.  Otherwise, you will receive a letter about your results with an explanation, but please check with MyChart first.  Please make an Appointment to return in Jan 2026, or sooner if needed

## 2023-11-30 ENCOUNTER — Encounter: Payer: Self-pay | Admitting: Internal Medicine

## 2023-11-30 NOTE — Assessment & Plan Note (Signed)
 Etiology unclear, atypical, for stress test referral.

## 2023-11-30 NOTE — Assessment & Plan Note (Signed)

## 2023-11-30 NOTE — Assessment & Plan Note (Signed)
 Lab Results  Component Value Date   VITAMINB12 415 11/28/2023   Stable, cont oral replacement - b12 1000 mcg qd

## 2023-11-30 NOTE — Assessment & Plan Note (Signed)
 Lab Results  Component Value Date   LDLCALC 66 11/28/2023   Stable, pt to continue current statin crestor  10 mg qd

## 2023-11-30 NOTE — Assessment & Plan Note (Signed)
 Lab Results  Component Value Date   HGBA1C 6.1 11/28/2023   Stable, pt to continue current medical treatment  - diet, wt control

## 2023-11-30 NOTE — Assessment & Plan Note (Signed)
 Last vitamin D  Lab Results  Component Value Date   VD25OH 25.88 (L) 11/28/2023   Low, to start oral replacement

## 2023-12-10 ENCOUNTER — Other Ambulatory Visit: Payer: Self-pay | Admitting: Internal Medicine

## 2023-12-10 DIAGNOSIS — M109 Gout, unspecified: Secondary | ICD-10-CM

## 2023-12-11 ENCOUNTER — Other Ambulatory Visit: Payer: Self-pay

## 2023-12-17 ENCOUNTER — Telehealth (HOSPITAL_COMMUNITY): Payer: Self-pay | Admitting: Internal Medicine

## 2023-12-17 NOTE — Telephone Encounter (Signed)
 We have attempted to contact the ordering providers office to obtain a prior authorization for the ordered test, Myoview.  However, we have been unsuccesful. Order will be removed from the active order WQ. Once the prior authorization is obtained we will reinstate the order and schedule patient.     12/17/23 Order removed from the WQ due to no response for PA# from ordering MD OFFICE/[facility]  12/10/23 INbasket sent for PA# 12/05/23 Inbasket for PA# x2  11/28/23 INBASKET SENT FOR PA#    Thank you

## 2024-01-06 DIAGNOSIS — D3132 Benign neoplasm of left choroid: Secondary | ICD-10-CM | POA: Diagnosis not present

## 2024-01-24 ENCOUNTER — Other Ambulatory Visit: Payer: Self-pay | Admitting: Internal Medicine

## 2024-02-15 ENCOUNTER — Other Ambulatory Visit: Payer: Self-pay | Admitting: Internal Medicine

## 2024-02-15 ENCOUNTER — Other Ambulatory Visit: Payer: Self-pay | Admitting: Family Medicine

## 2024-02-16 NOTE — Telephone Encounter (Signed)
 Last OV 03/25/23 Next OV not scheduled  Last refill 05/16/23 Qty #90/3

## 2024-03-16 ENCOUNTER — Telehealth: Payer: Self-pay

## 2024-03-16 ENCOUNTER — Encounter: Admitting: Internal Medicine

## 2024-03-16 NOTE — Telephone Encounter (Signed)
 Copied from CRM #[id number]. Topic: Clinical - Request for Lab/Test Order >> Mar 16, 2024 10:35 AM [name] wrote: Reason for CRM: Patient called in regarding labs for his upcoming physical appointment, would like to get a call to scheduled once those labs are in the chart

## 2024-03-16 NOTE — Telephone Encounter (Signed)
 Called and let Pt know, Pt states understanding.

## 2024-03-20 ENCOUNTER — Other Ambulatory Visit: Payer: Self-pay | Admitting: Internal Medicine

## 2024-03-25 ENCOUNTER — Encounter: Admitting: Internal Medicine

## 2024-04-15 ENCOUNTER — Encounter: Admitting: Internal Medicine

## 2024-04-21 ENCOUNTER — Encounter (HOSPITAL_COMMUNITY): Payer: Self-pay | Admitting: Internal Medicine

## 2024-04-26 ENCOUNTER — Other Ambulatory Visit: Payer: Self-pay | Admitting: Internal Medicine

## 2024-04-26 ENCOUNTER — Telehealth (HOSPITAL_COMMUNITY): Payer: Self-pay | Admitting: *Deleted

## 2024-04-26 ENCOUNTER — Telehealth (HOSPITAL_COMMUNITY): Payer: Self-pay

## 2024-04-26 DIAGNOSIS — R079 Chest pain, unspecified: Secondary | ICD-10-CM

## 2024-04-26 NOTE — Telephone Encounter (Signed)
 Left message on voicemail in reference to upcoming appointment scheduled for  04/28/24 Phone number given for a call back so details instructions can be given. Claudene Ronal Quale, RN

## 2024-04-26 NOTE — Telephone Encounter (Signed)
 Spoke with the patient, detailed instructions given. S.Sueo Cullen CCT

## 2024-04-27 ENCOUNTER — Encounter (HOSPITAL_COMMUNITY): Payer: Self-pay | Admitting: *Deleted

## 2024-04-28 ENCOUNTER — Ambulatory Visit (HOSPITAL_COMMUNITY)
Admission: RE | Admit: 2024-04-28 | Discharge: 2024-04-28 | Disposition: A | Discharge status: Home / Self Care | Source: Ambulatory Visit | Attending: Cardiology | Admitting: Cardiology

## 2024-04-28 ENCOUNTER — Ambulatory Visit: Payer: Self-pay | Admitting: Internal Medicine

## 2024-04-28 ENCOUNTER — Other Ambulatory Visit: Payer: Self-pay | Admitting: Internal Medicine

## 2024-04-28 DIAGNOSIS — R9389 Abnormal findings on diagnostic imaging of other specified body structures: Secondary | ICD-10-CM

## 2024-04-28 DIAGNOSIS — R079 Chest pain, unspecified: Secondary | ICD-10-CM | POA: Insufficient documentation

## 2024-04-28 DIAGNOSIS — R918 Other nonspecific abnormal finding of lung field: Secondary | ICD-10-CM

## 2024-04-28 LAB — MYOCARDIAL PERFUSION IMAGING
Angina Index: 0
Duke Treadmill Score: 4
Estimated workload: 4.6
Exercise duration (min): 4 min
Exercise duration (sec): 0 s
LV dias vol: 73 mL (ref 62–150)
LV sys vol: 18 mL (ref 4.2–5.8)
MPHR: 144 {beats}/min
Nuc Stress EF: 75 %
Peak HR: 150 {beats}/min
Percent HR: 104 %
Rest HR: 81 {beats}/min
Rest Nuclear Isotope Dose: 9.8 mCi
SDS: 0
SRS: 0
SSS: 0
ST Depression (mm): 0 mm
Stress Nuclear Isotope Dose: 32.7 mCi
TID: 0.77

## 2024-04-28 MED ORDER — TECHNETIUM TC 99M TETROFOSMIN IV KIT
32.7000 | PACK | Freq: Once | INTRAVENOUS | Status: AC | PRN
  Administered 2024-04-28: 32.7 via INTRAVENOUS

## 2024-04-28 MED ORDER — TECHNETIUM TC 99M TETROFOSMIN IV KIT
9.8000 | PACK | Freq: Once | INTRAVENOUS | Status: AC | PRN
  Administered 2024-04-28: 9.8 via INTRAVENOUS

## 2024-04-30 ENCOUNTER — Telehealth: Payer: Self-pay

## 2024-04-30 NOTE — Telephone Encounter (Signed)
 Copied from CRM [id number]. Topic: General - Other >> Apr 30, 2024 12:06 PM Alexandria E wrote: Reason for CRM: Mae with [employer] called in regarding a prior authorization. She stated that the CPT code for a procedure that was entered is invalid and she would like to speak with someone in the prior auth department about this. Callback number [phone].

## 2024-05-17 ENCOUNTER — Other Ambulatory Visit: Payer: Self-pay | Admitting: Family Medicine

## 2024-05-18 NOTE — Telephone Encounter (Signed)
 Last OV 03/25/23 Next OV not scheduled  Pt has not been seen in 1 year. Lets reach out to pt to assist with scheduling f/u appointment please.

## 2024-05-25 ENCOUNTER — Ambulatory Visit: Admitting: Family Medicine

## 2024-05-25 VITALS — BP 198/92 | HR 74 | Ht 69.0 in | Wt 229.0 lb

## 2024-05-25 DIAGNOSIS — M109 Gout, unspecified: Secondary | ICD-10-CM

## 2024-05-25 LAB — BASIC METABOLIC PANEL WITH GFR
BUN: 12 mg/dL (ref 6–23)
CO2: 28 meq/L (ref 19–32)
Calcium: 9.6 mg/dL (ref 8.4–10.5)
Chloride: 104 meq/L (ref 96–112)
Creatinine, Ser: 1.3 mg/dL (ref 0.40–1.50)
GFR: 53.42 mL/min — ABNORMAL LOW (ref >60.00)
Glucose, Bld: 93 mg/dL (ref 70–99)
Potassium: 3.7 meq/L (ref 3.5–5.1)
Sodium: 140 meq/L (ref 135–145)

## 2024-05-25 LAB — URIC ACID: Uric Acid, Serum: 3.9 mg/dL — ABNORMAL LOW (ref 4.0–7.8)

## 2024-05-25 NOTE — Progress Notes (Signed)
° °  LILLETTE Ileana Collet, PhD, LAT, ATC acting as a scribe for Artist Lloyd, MD.  Luis Myers is a 78 y.o. male who presents to [facility] today for rx management for his gout. Pt was last seen by Dr. Lloyd on 03/25/23 and was advised to cont colchicine  and prescribed Uloric .  Today, pt reports no gout flares. He tried to stop the colchicine , but then started to have a flare. About 1.5 month ago, he started having L knee pain, but resolved after wearing an old knee brace.  He is still taking the Uloric .  Dx testing: 05/15/23 Lab 02/18/23 R ankle XR & labs   Pertinent review of systems: No fevers or chills  Relevant historical information: Hypertension and gout   Exam:  BP (!) 198/92   Pulse 74   Ht 5' 9 (1.753 m)   Wt 229 lb (103.9 kg)   SpO2 98%   BMI 33.82 kg/m  General: Well Developed, well nourished, and in no acute distress.   MSK: Normal gait    Lab and Radiology Results No results found for this or any previous visit (from the past 72 hours). No results found.     Assessment and Plan: 78 y.o. male with gout.  Will check uric acid and metabolic panel.  Anticipate refilling or increase in the dose of Uloric  and refilling colchicine .  As long as things are going okay he should not have to take colchicine  daily. Follow-up with PCP regarding blood pressure.  PDMP not reviewed this encounter. Orders Placed This Encounter  Procedures   Uric acid    Standing Status:   Future    Number of Occurrences:   1    Expiration Date:   05/25/2025   Basic metabolic panel with GFR    Standing Status:   Future    Number of Occurrences:   1    Expiration Date:   05/25/2025   No orders of the defined types were placed in this encounter.    Discussed warning signs or symptoms. Please see discharge instructions. Patient expresses understanding.   The above documentation has been reviewed and is accurate and complete Artist Lloyd, M.D.

## 2024-05-25 NOTE — Patient Instructions (Addendum)
 Thank you for coming in today.   Please get labs today before you leave   Check back as needed

## 2024-05-26 ENCOUNTER — Ambulatory Visit: Payer: Self-pay | Admitting: Family Medicine

## 2024-05-26 DIAGNOSIS — M109 Gout, unspecified: Secondary | ICD-10-CM

## 2024-05-26 MED ORDER — FEBUXOSTAT 40 MG PO TABS: 40.0000 mg | ORAL_TABLET | Freq: Every day | ORAL | 3 refills | Status: AC

## 2024-05-26 MED ORDER — COLCHICINE 0.6 MG PO TABS: 0.6000 mg | ORAL_TABLET | Freq: Every day | ORAL | 3 refills | Status: AC | PRN

## 2024-05-26 NOTE — Progress Notes (Signed)
 Uric acid and kidney function are okay.  I have sent refills of both the colchicine  and the Uloric .  Take colchicine  as needed and take Uloric  every day.

## 2024-11-30 ENCOUNTER — Ambulatory Visit
